# Patient Record
Sex: Female | Born: 1980 | Race: White | Hispanic: No | Marital: Married | Smoking: Never smoker
Health system: Southern US, Community
[De-identification: ages and names within clinical notes are randomized; demographics above are authoritative.]

## PROBLEM LIST (undated history)

## (undated) DIAGNOSIS — F32A Depression, unspecified: Secondary | ICD-10-CM

## (undated) DIAGNOSIS — Z8619 Personal history of other infectious and parasitic diseases: Secondary | ICD-10-CM

## (undated) DIAGNOSIS — F329 Major depressive disorder, single episode, unspecified: Secondary | ICD-10-CM

## (undated) DIAGNOSIS — D649 Anemia, unspecified: Secondary | ICD-10-CM

## (undated) HISTORY — DX: Major depressive disorder, single episode, unspecified: F32.9

## (undated) HISTORY — DX: Anemia, unspecified: D64.9

## (undated) HISTORY — PX: NO PAST SURGERIES: SHX2092

## (undated) HISTORY — DX: Personal history of other infectious and parasitic diseases: Z86.19

## (undated) HISTORY — DX: Depression, unspecified: F32.A

## (undated) HISTORY — PX: MOUTH SURGERY: SHX715

---

## 2010-08-04 LAB — HEPATITIS B SURFACE ANTIGEN: Hepatitis B Surface Ag: NEGATIVE

## 2010-08-04 LAB — ABO/RH: RH Type: POSITIVE

## 2010-08-04 LAB — GC/CHLAMYDIA PROBE AMP, GENITAL: Chlamydia: NEGATIVE

## 2010-08-04 LAB — RUBELLA ANTIBODY, IGM: Rubella: IMMUNE

## 2011-02-01 ENCOUNTER — Inpatient Hospital Stay (HOSPITAL_COMMUNITY): Admission: AD | Admit: 2011-02-01 | Payer: Self-pay | Source: Ambulatory Visit | Admitting: Obstetrics and Gynecology

## 2011-02-08 LAB — STREP B DNA PROBE: GBS: NEGATIVE

## 2011-03-03 ENCOUNTER — Telehealth (HOSPITAL_COMMUNITY): Payer: Self-pay | Admitting: *Deleted

## 2011-03-03 ENCOUNTER — Encounter (HOSPITAL_COMMUNITY): Payer: Self-pay | Admitting: *Deleted

## 2011-03-03 NOTE — Telephone Encounter (Signed)
Preadmission screen  

## 2011-03-08 ENCOUNTER — Encounter (HOSPITAL_COMMUNITY): Payer: Self-pay | Admitting: *Deleted

## 2011-03-09 ENCOUNTER — Inpatient Hospital Stay (HOSPITAL_COMMUNITY): Admission: AD | Admit: 2011-03-09 | Payer: Self-pay | Source: Ambulatory Visit | Admitting: Obstetrics and Gynecology

## 2011-03-11 ENCOUNTER — Inpatient Hospital Stay (HOSPITAL_COMMUNITY): Admission: RE | Admit: 2011-03-11 | Payer: Self-pay | Source: Ambulatory Visit

## 2011-03-12 ENCOUNTER — Telehealth (HOSPITAL_COMMUNITY): Payer: Self-pay | Admitting: *Deleted

## 2011-03-12 NOTE — Telephone Encounter (Signed)
Preadmission screen  

## 2011-03-15 ENCOUNTER — Other Ambulatory Visit: Payer: Self-pay | Admitting: Obstetrics and Gynecology

## 2011-03-15 ENCOUNTER — Inpatient Hospital Stay (HOSPITAL_COMMUNITY): Payer: BC Managed Care – PPO

## 2011-03-15 ENCOUNTER — Inpatient Hospital Stay (HOSPITAL_COMMUNITY)
Admission: RE | Admit: 2011-03-15 | Discharge: 2011-03-19 | DRG: 373 | Disposition: A | Discharge status: Home / Self Care | Payer: BC Managed Care – PPO | Source: Ambulatory Visit | Attending: Obstetrics and Gynecology | Admitting: Obstetrics and Gynecology

## 2011-03-15 DIAGNOSIS — O99892 Other specified diseases and conditions complicating childbirth: Secondary | ICD-10-CM | POA: Diagnosis present

## 2011-03-15 DIAGNOSIS — L909 Atrophic disorder of skin, unspecified: Secondary | ICD-10-CM | POA: Diagnosis present

## 2011-03-15 DIAGNOSIS — O480 Post-term pregnancy: Secondary | ICD-10-CM | POA: Diagnosis present

## 2011-03-15 DIAGNOSIS — Z3400 Encounter for supervision of normal first pregnancy, unspecified trimester: Secondary | ICD-10-CM

## 2011-03-15 LAB — CBC
HCT: 31.1 % — ABNORMAL LOW (ref 36.0–46.0)
Hemoglobin: 10.4 g/dL — ABNORMAL LOW (ref 12.0–15.0)
MCH: 33 pg (ref 26.0–34.0)
MCHC: 33.4 g/dL (ref 30.0–36.0)
MCV: 98.7 fL (ref 78.0–100.0)

## 2011-03-15 MED ORDER — FLEET ENEMA 7-19 GM/118ML RE ENEM: 1.0000 | ENEMA | RECTAL | Status: DC | PRN

## 2011-03-15 MED ORDER — TERBUTALINE SULFATE 1 MG/ML IJ SOLN: 0.2500 mg | Freq: Once | INTRAMUSCULAR | Status: AC | PRN

## 2011-03-15 MED ORDER — OXYTOCIN 20 UNITS IN LACTATED RINGERS INFUSION - SIMPLE
125.0000 mL/h | Freq: Once | INTRAVENOUS | Status: AC
  Administered 2011-03-17: 999 mL/h via INTRAVENOUS

## 2011-03-15 MED ORDER — OXYTOCIN BOLUS FROM INFUSION
500.0000 mL | Freq: Once | INTRAVENOUS | Status: DC
  Filled 2011-03-15: qty 500

## 2011-03-15 MED ORDER — CITRIC ACID-SODIUM CITRATE 334-500 MG/5ML PO SOLN: 30.0000 mL | ORAL | Status: DC | PRN

## 2011-03-15 MED ORDER — MISOPROSTOL 25 MCG QUARTER TABLET
25.0000 ug | ORAL_TABLET | ORAL | Status: DC | PRN
  Administered 2011-03-15 – 2011-03-16 (×3): 25 ug via VAGINAL
  Filled 2011-03-15 (×3): qty 0.25

## 2011-03-15 MED ORDER — LACTATED RINGERS IV SOLN
INTRAVENOUS | Status: DC
  Administered 2011-03-16 – 2011-03-17 (×2): via INTRAVENOUS

## 2011-03-15 MED ORDER — ACETAMINOPHEN 325 MG PO TABS: 650.0000 mg | ORAL_TABLET | ORAL | Status: DC | PRN

## 2011-03-15 MED ORDER — LACTATED RINGERS IV SOLN
500.0000 mL | INTRAVENOUS | Status: DC | PRN
  Administered 2011-03-17: 300 mL via INTRAVENOUS

## 2011-03-16 ENCOUNTER — Inpatient Hospital Stay (HOSPITAL_COMMUNITY): Payer: BC Managed Care – PPO | Admitting: Anesthesiology

## 2011-03-16 ENCOUNTER — Encounter (HOSPITAL_COMMUNITY): Payer: Self-pay | Admitting: Anesthesiology

## 2011-03-16 MED ORDER — TERBUTALINE SULFATE 1 MG/ML IJ SOLN: 0.2500 mg | Freq: Once | INTRAMUSCULAR | Status: AC | PRN

## 2011-03-16 MED ORDER — FENTANYL 2.5 MCG/ML BUPIVACAINE 1/10 % EPIDURAL INFUSION (WH - ANES)
INTRAMUSCULAR | Status: AC
  Administered 2011-03-16: 14 mL/h via EPIDURAL
  Filled 2011-03-16: qty 60

## 2011-03-16 MED ORDER — FENTANYL 2.5 MCG/ML BUPIVACAINE 1/10 % EPIDURAL INFUSION (WH - ANES)
14.0000 mL/h | INTRAMUSCULAR | Status: DC
  Administered 2011-03-16 – 2011-03-17 (×4): 14 mL/h via EPIDURAL
  Filled 2011-03-16 (×4): qty 60

## 2011-03-16 MED ORDER — LACTATED RINGERS IV SOLN
500.0000 mL | Freq: Once | INTRAVENOUS | Status: AC
  Administered 2011-03-16: 500 mL via INTRAVENOUS

## 2011-03-16 MED ORDER — PHENYLEPHRINE 40 MCG/ML (10ML) SYRINGE FOR IV PUSH (FOR BLOOD PRESSURE SUPPORT)
80.0000 ug | PREFILLED_SYRINGE | INTRAVENOUS | Status: DC | PRN
  Filled 2011-03-16: qty 5

## 2011-03-16 MED ORDER — OXYTOCIN 20 UNITS IN LACTATED RINGERS INFUSION - SIMPLE
1.0000 m[IU]/min | INTRAVENOUS | Status: DC
  Administered 2011-03-16: 1 m[IU]/min via INTRAVENOUS
  Filled 2011-03-16: qty 1000

## 2011-03-16 MED ORDER — ONDANSETRON 8 MG/NS 50 ML IVPB
8.0000 mg | Freq: Four times a day (QID) | INTRAVENOUS | Status: DC | PRN
  Administered 2011-03-16: 8 mg via INTRAVENOUS
  Filled 2011-03-16: qty 8

## 2011-03-16 MED ORDER — PHENYLEPHRINE 40 MCG/ML (10ML) SYRINGE FOR IV PUSH (FOR BLOOD PRESSURE SUPPORT): 80.0000 ug | PREFILLED_SYRINGE | INTRAVENOUS | Status: DC | PRN

## 2011-03-16 MED ORDER — EPHEDRINE 5 MG/ML INJ
10.0000 mg | INTRAVENOUS | Status: DC | PRN
  Filled 2011-03-16: qty 4

## 2011-03-16 MED ORDER — EPHEDRINE 5 MG/ML INJ: 10.0000 mg | INTRAVENOUS | Status: DC | PRN

## 2011-03-16 MED ORDER — DIPHENHYDRAMINE HCL 50 MG/ML IJ SOLN: 12.5000 mg | INTRAMUSCULAR | Status: DC | PRN

## 2011-03-16 MED ORDER — LIDOCAINE HCL 1.5 % IJ SOLN
INTRAMUSCULAR | Status: DC | PRN
  Administered 2011-03-16: 2 mL via INTRADERMAL
  Administered 2011-03-16 (×2): 5 mL via INTRADERMAL

## 2011-03-16 NOTE — Anesthesia Preprocedure Evaluation (Addendum)
Anesthesia Evaluation  Patient identified by MRN, date of birth, ID band Patient awake    Reviewed: Allergy & Precautions, H&P , NPO status , Patient's Chart, lab work & pertinent test results, reviewed documented beta blocker date and time   History of Anesthesia Complications Negative for: history of anesthetic complications  Airway Mallampati: I TM Distance: >3 FB Neck ROM: full    Dental  (+) Teeth Intact   Pulmonary neg pulmonary ROS,  clear to auscultation        Cardiovascular neg cardio ROS regular Normal    Neuro/Psych Negative Neurological ROS  Negative Psych ROS   GI/Hepatic negative GI ROS, Neg liver ROS,   Endo/Other  Negative Endocrine ROS  Renal/GU negative Renal ROS  Genitourinary negative   Musculoskeletal   Abdominal   Peds  Hematology negative hematology ROS (+)   Anesthesia Other Findings   Reproductive/Obstetrics (+) Pregnancy                           Anesthesia Physical Anesthesia Plan  ASA: II  Anesthesia Plan: Epidural   Post-op Pain Management:    Induction:   Airway Management Planned:   Additional Equipment:   Intra-op Plan:   Post-operative Plan:   Informed Consent: I have reviewed the patients History and Physical, chart, labs and discussed the procedure including the risks, benefits and alternatives for the proposed anesthesia with the patient or authorized representative who has indicated his/her understanding and acceptance.     Plan Discussed with:   Anesthesia Plan Comments:         Anesthesia Quick Evaluation  

## 2011-03-16 NOTE — H&P (Signed)
30yo G1 @40 +6 presents for postdates IOL  Past History - see [facility]ollister, GBS neg  Af, VSS Gen - NAD Abd - gravid, NT Cvx FT/thick vtx by [place] upon admission last pm  A/P:  IOL.

## 2011-03-16 NOTE — Progress Notes (Signed)
Pt feeling mild contractions.  cytotec #3 placed at 8am by rn  FHT reassuring Toco - Q2-4 Cvx FT/60/-2  A/P:  Continue postdates IOL

## 2011-03-16 NOTE — Progress Notes (Signed)
Pt getting more uncomfortable w/ ctx.  ? LOF  AF, VSS FHT reassuring Toco - q71min Cvx 1-2/60/-2 Unable to arom forebag b/c of pt discomfort  A/P:  Exp mngt.  If contractions become less freqent or no cvx change, will add pitocin

## 2011-03-16 NOTE — Anesthesia Procedure Notes (Signed)
Epidural Patient location during procedure: OB Start time: 03/16/2011 6:14 PM Reason for block: procedure for pain  Staffing Performed by: anesthesiologist   Preanesthetic Checklist Completed: patient identified, site marked, surgical consent, pre-op evaluation, timeout performed, IV checked, risks and benefits discussed and monitors and equipment checked  Epidural Patient position: sitting Prep: site prepped and draped and DuraPrep Patient monitoring: continuous pulse ox and blood pressure Approach: midline Injection technique: LOR air  Needle:  Needle type: Tuohy  Needle gauge: 17 G Needle length: 9 cm Needle insertion depth: 4 cm Catheter type: closed end flexible Catheter size: 19 Gauge Catheter at skin depth: 9 cm Test dose: negative  Assessment Events: blood not aspirated, injection not painful, no injection resistance, negative IV test and no paresthesia  Additional Notes Discussed risk of headache, infection, bleeding, nerve injury and failed or incomplete block.  Patient voices understanding and wishes to proceed.

## 2011-03-16 NOTE — Progress Notes (Signed)
Pt comfortable w/ epidural.  Resting comfortably  FHT reassuring Toco - Q3-4 Cvx 3/90/-2 Forebag ruptured - clear fluid  A/P:  Continue pitocin, exp mngt

## 2011-03-17 ENCOUNTER — Encounter (HOSPITAL_COMMUNITY): Payer: Self-pay

## 2011-03-17 ENCOUNTER — Other Ambulatory Visit: Payer: Self-pay | Admitting: Obstetrics and Gynecology

## 2011-03-17 DIAGNOSIS — Z3400 Encounter for supervision of normal first pregnancy, unspecified trimester: Secondary | ICD-10-CM

## 2011-03-17 MED ORDER — DIPHENHYDRAMINE HCL 25 MG PO CAPS: 25.0000 mg | ORAL_CAPSULE | Freq: Four times a day (QID) | ORAL | Status: DC | PRN

## 2011-03-17 MED ORDER — WITCH HAZEL-GLYCERIN EX PADS
1.0000 "application " | MEDICATED_PAD | CUTANEOUS | Status: DC | PRN
  Administered 2011-03-17: 1 via TOPICAL

## 2011-03-17 MED ORDER — LANOLIN HYDROUS EX OINT: TOPICAL_OINTMENT | CUTANEOUS | Status: DC | PRN

## 2011-03-17 MED ORDER — MEDROXYPROGESTERONE ACETATE 150 MG/ML IM SUSP: 150.0000 mg | INTRAMUSCULAR | Status: DC | PRN

## 2011-03-17 MED ORDER — ONDANSETRON HCL 4 MG PO TABS
4.0000 mg | ORAL_TABLET | ORAL | Status: DC | PRN
  Administered 2011-03-17: 4 mg via ORAL
  Filled 2011-03-17: qty 1

## 2011-03-17 MED ORDER — SIMETHICONE 80 MG PO CHEW: 80.0000 mg | CHEWABLE_TABLET | ORAL | Status: DC | PRN

## 2011-03-17 MED ORDER — MEASLES, MUMPS & RUBELLA VAC ~~LOC~~ INJ
0.5000 mL | INJECTION | Freq: Once | SUBCUTANEOUS | Status: DC
  Filled 2011-03-17: qty 0.5

## 2011-03-17 MED ORDER — ONDANSETRON HCL 4 MG/2ML IJ SOLN: 4.0000 mg | INTRAMUSCULAR | Status: DC | PRN

## 2011-03-17 MED ORDER — DIBUCAINE 1 % RE OINT
1.0000 "application " | TOPICAL_OINTMENT | RECTAL | Status: DC | PRN
  Administered 2011-03-17 – 2011-03-19 (×2): 1 via RECTAL
  Filled 2011-03-17 (×2): qty 28

## 2011-03-17 MED ORDER — PRENATAL PLUS 27-1 MG PO TABS
1.0000 | ORAL_TABLET | Freq: Every day | ORAL | Status: DC
  Administered 2011-03-18 – 2011-03-19 (×2): 1 via ORAL
  Filled 2011-03-17 (×2): qty 1

## 2011-03-17 MED ORDER — BENZOCAINE-MENTHOL 20-0.5 % EX AERO
INHALATION_SPRAY | CUTANEOUS | Status: AC
  Filled 2011-03-17: qty 56

## 2011-03-17 MED ORDER — BENZOCAINE-MENTHOL 20-0.5 % EX AERO: 1.0000 "application " | INHALATION_SPRAY | CUTANEOUS | Status: DC | PRN

## 2011-03-17 MED ORDER — TETANUS-DIPHTH-ACELL PERTUSSIS 5-2.5-18.5 LF-MCG/0.5 IM SUSP: 0.5000 mL | Freq: Once | INTRAMUSCULAR | Status: DC

## 2011-03-17 MED ORDER — LIDOCAINE HCL (PF) 1 % IJ SOLN
INTRAMUSCULAR | Status: AC
  Filled 2011-03-17: qty 30

## 2011-03-17 MED ORDER — SENNOSIDES-DOCUSATE SODIUM 8.6-50 MG PO TABS
2.0000 | ORAL_TABLET | Freq: Every day | ORAL | Status: DC
  Administered 2011-03-17 – 2011-03-18 (×2): 2 via ORAL

## 2011-03-17 MED ORDER — ACETAMINOPHEN 325 MG PO TABS
650.0000 mg | ORAL_TABLET | Freq: Four times a day (QID) | ORAL | Status: DC | PRN
  Administered 2011-03-17 – 2011-03-19 (×6): 650 mg via ORAL
  Filled 2011-03-17 (×2): qty 2
  Filled 2011-03-17: qty 1
  Filled 2011-03-17: qty 2
  Filled 2011-03-17: qty 1
  Filled 2011-03-17: qty 2
  Filled 2011-03-17: qty 1

## 2011-03-17 NOTE — Anesthesia Postprocedure Evaluation (Signed)
  Anesthesia Post-op Note  Patient: Real Cons  Procedure(s) Performed: * No procedures listed *  Patient Location: Mother/Baby  Anesthesia Type: Epidural  Level of Consciousness: awake, alert  and oriented  Airway and Oxygen Therapy: Patient Spontanous Breathing  Post-op Pain: mild  Post-op Assessment: Post-op Vital signs reviewed  Post-op Vital Signs: stable  Complications: No apparent anesthesia complications

## 2011-03-17 NOTE — Addendum Note (Signed)
Addendum  created 03/17/11 1341 by Quin Hoop Stellah Donovan   Modules edited:Charges VN, Notes Section

## 2011-03-17 NOTE — Progress Notes (Signed)
Pt comfortable w/ epidural.  CTSP after fetal decel.  Pitocin off, IVF bolus running  FHT - reassuring now with BTBV.  Baseline 115-120s w/ good scalp stim.  Early decels Toco Q2-3 Cvx 5/c/-1  A/P:  Watch closely, if decels persist or FHT become non-reassuring - will do c-section

## 2011-03-17 NOTE — Progress Notes (Signed)
SVD of vigerous female infant w/ apgars of 9,9.  Placenta delivered spontaneous w/ 3VC.   2nd degree lac repaired w/ 3-0 vicryl rapide.  Fundus firm.  EBL 500 .   Skin tag removed from right buttock.  Skin re-approximated with 3-0 vicryl rapide

## 2011-03-17 NOTE — Anesthesia Postprocedure Evaluation (Signed)
Anesthesia Post Note  Patient: Katherine Johnson  Procedure(s) Performed: * No procedures listed *  Anesthesia type: Epidural  Patient location: Mother/Baby  Post pain: Pain level controlled  Post assessment: Post-op Vital signs reviewed  Last Vitals:  Filed Vitals:   03/17/11 0801  BP: 105/72  Pulse: 100  Temp:   Resp:     Post vital signs: Reviewed  Level of consciousness: awake  Complications: No apparent anesthesia complications

## 2011-03-17 NOTE — Progress Notes (Deleted)
Pt comfortable w/ epidural  FHT reassuring Toco Q2-3 Cvx - c/c/+1  Will begin pushing

## 2011-03-17 NOTE — Progress Notes (Signed)
Pt resting comfortably w/ epidural.  FHT reassuring Toco - q2-4 Cvx c/c/+2  A/P:  Will start pushing

## 2011-03-18 LAB — CBC
HCT: 22.8 % — ABNORMAL LOW (ref 36.0–46.0)
Hemoglobin: 7.6 g/dL — ABNORMAL LOW (ref 12.0–15.0)
MCH: 33.3 pg (ref 26.0–34.0)
MCV: 100 fL (ref 78.0–100.0)
Platelets: 174 10*3/uL (ref 150–400)
RBC: 2.28 MIL/uL — ABNORMAL LOW (ref 3.87–5.11)
WBC: 11 10*3/uL — ABNORMAL HIGH (ref 4.0–10.5)

## 2011-03-18 MED ORDER — FERROUS SULFATE 325 (65 FE) MG PO TABS
325.0000 mg | ORAL_TABLET | Freq: Three times a day (TID) | ORAL | Status: DC
  Administered 2011-03-18 – 2011-03-19 (×3): 325 mg via ORAL
  Filled 2011-03-18 (×3): qty 1

## 2011-03-18 MED ORDER — HYDROCORTISONE ACE-PRAMOXINE 1-1 % RE FOAM
1.0000 | Freq: Two times a day (BID) | RECTAL | Status: DC
  Administered 2011-03-18: 1 via RECTAL
  Filled 2011-03-18: qty 10

## 2011-03-18 MED ORDER — BENZOCAINE-MENTHOL 20-0.5 % EX AERO
INHALATION_SPRAY | CUTANEOUS | Status: AC
  Filled 2011-03-18: qty 56

## 2011-03-18 NOTE — Progress Notes (Signed)
UR chart review completed.  

## 2011-03-18 NOTE — Progress Notes (Signed)
Subjective: Postpartum Day 1:  Patient reports tolerating PO, + flatus and no problems voiding.  Denies dizziness, SOB  Objective: Vital signs in last 24 hours: Temp:  [97.6 F (36.4 C)-99.2 F (37.3 C)] 98.2 F (36.8 C) (11/15 0558) Pulse Rate:  [76-104] 87  (11/15 0558) Resp:  [18] 18  (11/15 0558) BP: (94-105)/(63-72) 94/64 mmHg (11/15 0558)  Physical Exam:  General: alert and cooperative Lochia: appropriate Uterus:: firm Perineum intact, small hemorrhoid  DVT Evaluation: No evidence of DVT seen on physical exam.   Basename 03/18/11 0535 03/15/11 2045  HGB 7.6* 10.4*  HCT 22.8* 31.1*    Assessment/Plan: S/p vag delivery Continue current care. PRoctofoam HC FE  Elvis Laufer G 03/18/2011, 7:59 AM

## 2011-03-19 MED ORDER — HYDROCORTISONE ACE-PRAMOXINE 1-1 % RE FOAM: 1.0000 | Freq: Two times a day (BID) | RECTAL | Status: AC

## 2011-03-19 NOTE — Discharge Summary (Signed)
Obstetric Discharge Summary Reason for Admission: induction of labor Prenatal Procedures: ultrasound Intrapartum Procedures: spontaneous vaginal delivery Postpartum Procedures: none Complications-Operative and Postpartum: 2 degree perineal laceration Hemoglobin  Date Value Range Status  03/18/2011 7.6* 12.0-15.0 (g/dL) Final     HCT  Date Value Range Status  03/18/2011 22.8* 36.0-46.0 (%) Final    Discharge Diagnoses: Term Pregnancy-delivered  Discharge Information: Date: 03/19/2011 Activity: pelvic rest Diet: routine Medications: PNV, Colace and Iron Condition: stable Instructions: refer to practice specific booklet Discharge to: home   Newborn Data: Live born female  Birth Weight: 8 lb 0.2 oz (3634 g) APGAR: 9, 9  Home with mother.  Llesenia Fogal G 03/19/2011, 8:12 AM

## 2011-03-19 NOTE — Progress Notes (Signed)
Post Partum Day 2 Subjective: up ad lib, voiding, tolerating PO, + flatus and c/o hemorrhoidal discomfort  Objective: Blood pressure 95/61, pulse 87, temperature 98.1 F (36.7 C), temperature source Oral, resp. rate 18, last menstrual period 06/03/2010, unknown if currently breastfeeding.  Physical Exam:  General: alert and cooperative Lochia: appropriate Uterine Fundus: firm Perineum intact sm hemorrhoid DVT Evaluation: No evidence of DVT seen on physical exam.   Basename 03/18/11 0535  HGB 7.6*  HCT 22.8*    Assessment/Plan: Discharge home   LOS: 4 days   Katherine Johnson 03/19/2011, 8:07 AM

## 2012-05-02 LAB — OB RESULTS CONSOLE HIV ANTIBODY (ROUTINE TESTING): HIV: NONREACTIVE

## 2012-05-02 LAB — OB RESULTS CONSOLE GC/CHLAMYDIA
Chlamydia: NEGATIVE
Gonorrhea: NEGATIVE

## 2012-05-02 LAB — OB RESULTS CONSOLE RPR: RPR: NONREACTIVE

## 2012-05-02 LAB — OB RESULTS CONSOLE RUBELLA ANTIBODY, IGM: Rubella: IMMUNE

## 2012-05-03 NOTE — L&D Delivery Note (Signed)
Delivery Note  SVD viable female Apgars 9,9 over 2nd degree ML Laceration.  Placenta delivered spontaneously intact with 3VC. Repair with 2-0 Chromic with good support and hemostasis noted and R/V exam confirms.  PH art was sent.  [place] cord blood was done.  Mother and baby were doing well.  EBL 300cc  Candice Camp, MD

## 2012-11-27 ENCOUNTER — Telehealth: Payer: Self-pay | Admitting: Hematology and Oncology

## 2012-11-27 NOTE — Telephone Encounter (Signed)
S/W PT IN RE NP APPT 08/06 @ 1:30 W/DR. [place] REFERRING DR. Aundra Millet [facility] DX- ANEMIA; PREGNANCY HGB 9.4 37WKS WELCOME PACKET MAILED.

## 2012-11-28 ENCOUNTER — Telehealth: Payer: Self-pay | Admitting: Hematology and Oncology

## 2012-11-28 NOTE — Telephone Encounter (Signed)
C/D 11/28/12 for appt. 12/06/12

## 2012-12-06 ENCOUNTER — Telehealth: Payer: Self-pay | Admitting: Hematology and Oncology

## 2012-12-06 ENCOUNTER — Ambulatory Visit (HOSPITAL_BASED_OUTPATIENT_CLINIC_OR_DEPARTMENT_OTHER): Payer: BC Managed Care – PPO | Admitting: Hematology and Oncology

## 2012-12-06 ENCOUNTER — Encounter: Payer: Self-pay | Admitting: Hematology and Oncology

## 2012-12-06 ENCOUNTER — Ambulatory Visit: Payer: BC Managed Care – PPO

## 2012-12-06 ENCOUNTER — Other Ambulatory Visit (HOSPITAL_BASED_OUTPATIENT_CLINIC_OR_DEPARTMENT_OTHER): Payer: BC Managed Care – PPO | Admitting: Lab

## 2012-12-06 ENCOUNTER — Ambulatory Visit (HOSPITAL_BASED_OUTPATIENT_CLINIC_OR_DEPARTMENT_OTHER): Payer: BC Managed Care – PPO | Admitting: Lab

## 2012-12-06 VITALS — BP 101/68 | HR 89 | Temp 98.3°F | Resp 18 | Ht 61.0 in | Wt 134.0 lb

## 2012-12-06 DIAGNOSIS — O99019 Anemia complicating pregnancy, unspecified trimester: Secondary | ICD-10-CM

## 2012-12-06 DIAGNOSIS — D649 Anemia, unspecified: Secondary | ICD-10-CM

## 2012-12-06 LAB — CHCC SMEAR

## 2012-12-06 LAB — CBC & DIFF AND RETIC
BASO%: 0.1 % (ref 0.0–2.0)
HCT: 27.1 % — ABNORMAL LOW (ref 34.8–46.6)
MCHC: 33.2 g/dL (ref 31.5–36.0)
MONO#: 0.5 10*3/uL (ref 0.1–0.9)
NEUT%: 73.2 % (ref 38.4–76.8)
WBC: 7.3 10*3/uL (ref 3.9–10.3)
lymph#: 1.3 10*3/uL (ref 0.9–3.3)

## 2012-12-06 LAB — FOLATE: Folate: >20 ng/mL

## 2012-12-06 LAB — COMPREHENSIVE METABOLIC PANEL (CC13)
AST: 17 U/L (ref 5–34)
Albumin: 2.5 g/dL — ABNORMAL LOW (ref 3.5–5.0)
BUN: 7 mg/dL (ref 7.0–26.0)
CO2: 22 mEq/L (ref 22–29)
Calcium: 9 mg/dL (ref 8.4–10.4)
Chloride: 107 mEq/L (ref 98–109)
Glucose: 100 mg/dl (ref 70–140)
Potassium: 3.7 mEq/L (ref 3.5–5.1)

## 2012-12-06 LAB — VITAMIN B12: Vitamin B-12: 299 pg/mL (ref 211–911)

## 2012-12-06 LAB — LACTATE DEHYDROGENASE (CC13): LDH: 141 U/L (ref 125–245)

## 2012-12-06 NOTE — Progress Notes (Signed)
Checked in new patient. No financial issues. Gave mychart instructions-she may do if not phone and mail are still ok. Didn't ask if living will/POA.

## 2012-12-06 NOTE — Progress Notes (Signed)
[facility]  [phone] [phone]) [phone]   MEDICAL ONCOLOGY - INITIAL CONSULATION    Referral MD  Reason for Referral: Anemia  No chief complaint on file. HPI:   We had the pleasure of seeing Katherine Johnson in our hematology clinic today in consultation regarding her anemia. Katherine Johnson is a pleasant 32 years old 32 years old Caucasian female 4 is 4 is 34 ritual who is [redacted] weeks pregnant was found to be anemic with a hemoglobin of 7.7 on lab work that was done by her obstetrician. MCV on the blood work was 95.4 platelet count was 175K. patient has been on iron supplementation for the last few months. Patient denied any bleeding episodes, no recent travel or infection. No new medication. Denied any change in the urine or stool color. She feels well and she is really having no complaints except the usual symptoms of pregnancy.     No past medical history on file.:  Past Surgical History  Procedure Laterality Date  . Mouth surgery      as a child  :  Current Outpatient Prescriptions  Medication Sig Dispense Refill  . docusate sodium (COLACE) 100 MG capsule Take 100 mg by mouth daily.      . ranitidine (ZANTAC) 150 MG tablet Take 150 mg by mouth daily.      Marland Kitchen acetaminophen (TYLENOL) 500 MG tablet Take 1,000 mg by mouth every 6 (six) hours as needed. Patient used this medication for pain.       Marland Kitchen oxymetazoline (AFRIN) 0.05 % nasal spray Place 2 sprays into the nose 2 (two) times daily. Patient uses this medication for nasal congestion.       . prenatal vitamin w/FE, FA (PRENATAL 1 + 1) 27-1 MG TABS Take 1 tablet by mouth daily.         No current facility-administered medications for this visit.     Allergies  Allergen Reactions  . Codeine Nausea And Vomiting  . Ibuprofen Swelling    Lips swell  :  Family History  Problem Relation Age of Onset  . Depression Mother   . Depression Sister   . Depression Maternal Grandmother   . Heart failure  Maternal Grandfather   . Hypertension Maternal Grandfather   . Cancer Paternal Grandmother     breast  . Anesthesia problems Neg Hx   . Hypotension Neg Hx   . Malignant hyperthermia Neg Hx   . Pseudochol deficiency Neg Hx   :  History   Social History  . Marital Status: Married    Spouse Name: N/A    Number of Children: N/A  . Years of Education: N/A   Occupational History  . Not on file.   Social History Main Topics  . Smoking status: Never Smoker   . Smokeless tobacco: Never Used  . Alcohol Use: No  . Drug Use: No  . Sexually Active: Yes    Birth Control/ Protection: Pill   Other Topics Concern  . Not on file   Social History Narrative  . No narrative on file  :  Pertinent items are noted in HPI.  Exam: Blood pressure 101/68, pulse 89, temperature 98.3 F (36.8 C), temperature source Oral, resp. rate 18, height 5\' 1"  (1.549 m), weight 134 lb (60.782 kg), SpO2 100.00%, unknown if currently breastfeeding.   ECOG PERFORMANCE STATUS: 0 - Asymptomatic  GENERAL: No distress, well nourished.  SKIN:  No rashes or significant lesions  HEAD: Normocephalic, No masses,  lesions, tenderness or abnormalities  EYES: Conjunctiva are pink and non-injected  ENT: External ears normal ,lips , buccal mucosa, and tongue normal and mucous membranes are moist  LYMPH: No palpable lymphadenopathy  BREAST:Normal without mass, skin or nipple changes or axillary nodes,  LUNGS: Clear to auscultation, no crackles or wheezes HEART: Regular rate & rhythm, no murmurs, no gallops, S1 normal and S2 normal  ABDOMEN: Abdomen soft, non-tender, normal bowel sounds, no masses or organomegaly and no hepatosplenomegaly  MSK: No CVA tenderness and no tenderness on percussion of the back or rib cage. EXTREMITIES: No edema, no skin discoloration or tenderness NEURO: Alert & oriented, no focal motor/sensory deficits.  Lab Results  Component Value Date   WBC 7.3 12/06/2012   HGB 9.0* 12/06/2012   HCT  27.1* 12/06/2012   PLT 170 12/06/2012   GLUCOSE 100 12/06/2012   ALT 12 12/06/2012   AST 17 12/06/2012   NA 138 12/06/2012   K 3.7 12/06/2012   CREATININE 0.6 12/06/2012   BUN 7.0 12/06/2012   CO2 22 12/06/2012    Assessment and Plan: Katherine Johnson is 32 years old female always [redacted] weeks pregnant with normocytic anemia. Today in clinic with Real Cons we had a along discussion about her anemia. We we informed agent and her husband that we will go ahead and repeat her CBC, we also will get iron profile, folic acid and B12 level. However, if her anemia is normocytic most likely it is not due to iron deficiency, folic acid or Z61 deficiency; unless if she has a mixed picture. We also will check LDH, haptoglobin, along with bilirubin level to make sure patient is not hemolyzing. And of course we will review her peripheral blood smear. I also assured patient that the fact that her platelets count is normal, is reassuring sign.  Once we get the lab work back will call patient with the results if there is any need for immediate intervention. Otherwise patient will come back to our clinic next week to go over the results and the final plan. If patient deliver next week we would be happy to see her in the hospital if needed to.   Patient asked a series of questions which was answered hopefully to her staisfication.   Zachery Dakins, MD 12/06/2012 6:09 PM

## 2012-12-06 NOTE — Telephone Encounter (Signed)
Pt sent back to lab and given appt schedule for August.  °

## 2012-12-07 LAB — FERRITIN CHCC: Ferritin: 20 ng/ml (ref 9–269)

## 2012-12-07 LAB — IRON AND TIBC CHCC: Iron: 140 ug/dL (ref 41–142)

## 2012-12-08 ENCOUNTER — Telehealth: Payer: Self-pay | Admitting: Hematology and Oncology

## 2012-12-08 NOTE — Telephone Encounter (Signed)
Per desk nurse moved pt from CP1 to AJ. Time adjusted by to 12:15pm on 8/11. lmonvm for pt re change w/new time for 8/11 @ 12:15pm.

## 2012-12-11 ENCOUNTER — Encounter: Payer: Self-pay | Admitting: Internal Medicine

## 2012-12-11 ENCOUNTER — Ambulatory Visit (HOSPITAL_BASED_OUTPATIENT_CLINIC_OR_DEPARTMENT_OTHER): Payer: BC Managed Care – PPO | Admitting: Lab

## 2012-12-11 ENCOUNTER — Telehealth: Payer: Self-pay | Admitting: Hematology and Oncology

## 2012-12-11 ENCOUNTER — Ambulatory Visit (HOSPITAL_BASED_OUTPATIENT_CLINIC_OR_DEPARTMENT_OTHER): Payer: BC Managed Care – PPO | Admitting: Physician Assistant

## 2012-12-11 VITALS — BP 101/64 | HR 92 | Temp 98.1°F | Resp 18 | Ht 61.0 in | Wt 132.9 lb

## 2012-12-11 DIAGNOSIS — D649 Anemia, unspecified: Secondary | ICD-10-CM

## 2012-12-11 NOTE — Telephone Encounter (Signed)
Sent pt to lab and pt will call to schedule  appt

## 2012-12-11 NOTE — Patient Instructions (Addendum)
Follow up approximately 8 weeks after you delivery your baby. Call us for the appointment

## 2012-12-12 ENCOUNTER — Encounter (HOSPITAL_COMMUNITY): Payer: Self-pay | Admitting: *Deleted

## 2012-12-12 ENCOUNTER — Telehealth (HOSPITAL_COMMUNITY): Payer: Self-pay | Admitting: *Deleted

## 2012-12-12 LAB — DIRECT ANTIGLOBULIN TEST (NOT AT ARMC): DAT IgG: NEGATIVE

## 2012-12-12 NOTE — Telephone Encounter (Signed)
Preadmission screen  

## 2012-12-15 ENCOUNTER — Encounter (HOSPITAL_COMMUNITY): Payer: Self-pay | Admitting: *Deleted

## 2012-12-15 ENCOUNTER — Telehealth: Payer: Self-pay

## 2012-12-15 ENCOUNTER — Inpatient Hospital Stay (HOSPITAL_COMMUNITY): Admission: RE | Admit: 2012-12-15 | Payer: BC Managed Care – PPO | Source: Ambulatory Visit

## 2012-12-15 ENCOUNTER — Telehealth (HOSPITAL_COMMUNITY): Payer: Self-pay | Admitting: *Deleted

## 2012-12-15 NOTE — Telephone Encounter (Signed)
S/w pt that there is a receipt here on her visa card dated 12/06/12 for $80. She said she did not need it. I asked her twice with same response. I put receipt to be shredded.

## 2012-12-15 NOTE — Progress Notes (Signed)
[facility]  [phone] Fax:([phone]   MEDICAL ONCOLOGY       diagnosis: Anemia   No chief complaint on file. HPI:   Ms. Rickels presents accompanied by her husband for scheduled followup appointment regarding her anemia. She had laboratory studies drawn in our office when she was evaluated by Dr. Karel Jarvis on 12/06/2012. Her hemoglobin at that time was 9 g/dL. She had several other studies drawn at that time and presents today to discuss the results of those studies. She is due to deliver her baby 12/13/2012. We had the pleasure of seeing Ms. Zarling in our hematology clinic today in consultation regarding her anemia. Ms. Musil is a pleasant 32 years old Caucasian female  who is approximately [redacted] weeks pregnant was found to be anemic with a hemoglobin of 7.7 on lab work that was done by her obstetrician. MCV on the blood work was 95.4 platelet count was 175K. patient has been on iron supplementation for the last few months. Patient denied any bleeding episodes, no recent travel or infection. No new medication. Denied any change in the urine or stool color. She feels well and she is really having no complaints except the usual symptoms of late pregnancy.     Past Medical History  Diagnosis Date  . Hx of varicella   . 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 Anemia   :  Past Surgical History  Procedure Laterality Date  . Mouth surgery      as a child  . No past surgeries    :  Current Outpatient Prescriptions  Medication Sig Dispense Refill  . acetaminophen (TYLENOL) 500 MG tablet Take 1,000 mg by mouth every 6 (six) hours as needed. Patient used this medication for pain.       Marland Kitchen docusate sodium (COLACE) 100 MG capsule Take 100 mg by mouth daily.      Marland Kitchen oxymetazoline (AFRIN) 0.05 % nasal spray Place 2 sprays into the nose 2 (two) times daily. Patient uses this medication for nasal congestion.       . prenatal vitamin w/FE, FA (PRENATAL 1 + 1)  27-1 MG TABS Take 1 tablet by mouth daily.        . ranitidine (ZANTAC) 150 MG tablet Take 150 mg by mouth daily.       No current facility-administered medications for this visit.     Allergies  Allergen Reactions  . Codeine Nausea And Vomiting  . Ibuprofen Swelling    Lips swell  :  Family History  Problem Relation Age of Onset  . Depression Mother   . Depression Sister   . Depression Maternal Grandmother   . Hypertension Maternal Grandmother   . Heart failure Maternal Grandfather   . Hypertension Maternal Grandfather   . Cancer Paternal Grandmother     breast  . Anesthesia problems Neg Hx   . Hypotension Neg Hx   . Malignant hyperthermia Neg Hx   . Pseudochol deficiency Neg Hx   . Diabetes Paternal Grandfather   . Cancer Paternal Grandfather     lung  :  History   Social History  . Marital Status: Married    Spouse Name: N/A    Number of Children: N/A  . Years of Education: N/A   Occupational History  . Not on file.   Social History Main Topics  . Smoking status: Never Smoker   . Smokeless tobacco: Never Used  . Alcohol Use:  No  . Drug Use: No  . Sexual Activity: Yes    Birth Control/ Protection: Pill   Other Topics Concern  . Not on file   Social History Narrative  . No narrative on file  :  Pertinent items are noted in HPI.  Exam: Blood pressure 101/64, pulse 92, temperature 98.1 F (36.7 C), temperature source Oral, resp. rate 18, height 5\' 1"  (1.549 m), weight 132 lb 14.4 oz (60.283 kg), unknown if currently breastfeeding.   ECOG PERFORMANCE STATUS: 0 - Asymptomatic  GENERAL: No distress, well nourished.  SKIN:  No rashes or significant lesions  HEAD: Normocephalic, No masses, lesions, tenderness or abnormalities  EYES: Conjunctiva are pink and non-injected  ENT: External ears normal ,lips , buccal mucosa, and tongue normal and mucous membranes are moist  LYMPH: No palpable lymphadenopathy  BREAST:Normal without mass, skin or nipple  changes or axillary nodes,  LUNGS: Clear to auscultation, no crackles or wheezes HEART: Regular rate & rhythm, no murmurs, no gallops, S1 normal and S2 normal  ABDOMEN: Abdomen soft, non-tender, normal bowel sounds, no masses or organomegaly and no hepatosplenomegaly. Patient is currently [redacted] weeks pregnant, there is fetal movement as well as fetal heart sounds present. MSK: No CVA tenderness and no tenderness on percussion of the back or rib cage. EXTREMITIES: No edema, no skin discoloration or tenderness NEURO: Alert & oriented, no focal motor/sensory deficits.  Lab Results  Component Value Date   WBC 7.3 12/06/2012   HGB 9.0* 12/06/2012   HCT 27.1* 12/06/2012   PLT 170 12/06/2012   GLUCOSE 100 12/06/2012   ALT 12 12/06/2012   AST 17 12/06/2012   NA 138 12/06/2012   K 3.7 12/06/2012   CREATININE 0.6 12/06/2012   BUN 7.0 12/06/2012   CO2 22 12/06/2012    Assessment and Plan: Ms. Battin is 32 years old female approximately [redacted] weeks pregnant with normocytic anemia. The patient was discussed with Dr. Karel Jarvis. Her laboratory studies including iron studies, folate TSH and B12 were normal however the haptoglobin was not drawn. Patient's LDH was also normal. We will draw a haptoglobin today. The patient knows that when she is admitted to deliver her baby that the obstetrician may contact the hematology oncologist on call for a consultation if needed. We will plan to have the patient followup with [place] with repeat CBC differential and likely another set of iron studies approximately 8 weeks after she delivers her baby.   Patient asked a series of questions which were answered to her staisfication.   Laural Benes, [place] Sabine E, PA-C

## 2012-12-15 NOTE — Telephone Encounter (Signed)
Preadmission screen  

## 2012-12-20 ENCOUNTER — Inpatient Hospital Stay (HOSPITAL_COMMUNITY)
Admission: AD | Admit: 2012-12-20 | Discharge: 2012-12-23 | DRG: 373 | Disposition: A | Discharge status: Home / Self Care | Payer: BC Managed Care – PPO | Source: Ambulatory Visit | Attending: Obstetrics and Gynecology | Admitting: Obstetrics and Gynecology

## 2012-12-20 ENCOUNTER — Inpatient Hospital Stay (HOSPITAL_COMMUNITY): Admission: RE | Admit: 2012-12-20 | Payer: BC Managed Care – PPO | Source: Ambulatory Visit

## 2012-12-20 ENCOUNTER — Encounter (HOSPITAL_COMMUNITY): Payer: Self-pay | Admitting: *Deleted

## 2012-12-20 DIAGNOSIS — O480 Post-term pregnancy: Principal | ICD-10-CM | POA: Diagnosis present

## 2012-12-20 LAB — CBC
HCT: 27.7 % — ABNORMAL LOW (ref 36.0–46.0)
MCH: 34.2 pg — ABNORMAL HIGH (ref 26.0–34.0)
MCHC: 33.9 g/dL (ref 30.0–36.0)
MCV: 100.7 fL — ABNORMAL HIGH (ref 78.0–100.0)
Platelets: 190 10*3/uL (ref 150–400)
RDW: 13.1 % (ref 11.5–15.5)

## 2012-12-20 MED ORDER — OXYTOCIN 40 UNITS IN LACTATED RINGERS INFUSION - SIMPLE MED
1.0000 m[IU]/min | INTRAVENOUS | Status: DC
  Administered 2012-12-21: 2 m[IU]/min via INTRAVENOUS
  Administered 2012-12-21: 4 m[IU]/min via INTRAVENOUS
  Filled 2012-12-20: qty 1000

## 2012-12-20 MED ORDER — OXYTOCIN 40 UNITS IN LACTATED RINGERS INFUSION - SIMPLE MED: 62.5000 mL/h | INTRAVENOUS | Status: DC

## 2012-12-20 MED ORDER — ONDANSETRON HCL 4 MG/2ML IJ SOLN: 4.0000 mg | Freq: Four times a day (QID) | INTRAMUSCULAR | Status: DC | PRN

## 2012-12-20 MED ORDER — LACTATED RINGERS IV SOLN: 500.0000 mL | INTRAVENOUS | Status: DC | PRN

## 2012-12-20 MED ORDER — TERBUTALINE SULFATE 1 MG/ML IJ SOLN: 0.2500 mg | Freq: Once | INTRAMUSCULAR | Status: AC | PRN

## 2012-12-20 MED ORDER — CITRIC ACID-SODIUM CITRATE 334-500 MG/5ML PO SOLN
30.0000 mL | ORAL | Status: DC | PRN
  Administered 2012-12-21: 30 mL via ORAL
  Filled 2012-12-20: qty 15

## 2012-12-20 MED ORDER — OXYTOCIN BOLUS FROM INFUSION
500.0000 mL | INTRAVENOUS | Status: DC
  Administered 2012-12-21: 500 mL via INTRAVENOUS

## 2012-12-20 MED ORDER — MISOPROSTOL 25 MCG QUARTER TABLET
25.0000 ug | ORAL_TABLET | ORAL | Status: AC
  Administered 2012-12-20 – 2012-12-21 (×2): 25 ug via VAGINAL
  Filled 2012-12-20 (×2): qty 0.25

## 2012-12-20 MED ORDER — LACTATED RINGERS IV SOLN
INTRAVENOUS | Status: DC
  Administered 2012-12-20: 20:00:00 via INTRAVENOUS

## 2012-12-20 MED ORDER — LIDOCAINE HCL (PF) 1 % IJ SOLN
30.0000 mL | INTRAMUSCULAR | Status: DC | PRN
  Filled 2012-12-20 (×2): qty 30

## 2012-12-20 MED ORDER — ACETAMINOPHEN 325 MG PO TABS: 650.0000 mg | ORAL_TABLET | ORAL | Status: DC | PRN

## 2012-12-20 MED ORDER — ZOLPIDEM TARTRATE 5 MG PO TABS
5.0000 mg | ORAL_TABLET | Freq: Every evening | ORAL | Status: DC | PRN
  Administered 2012-12-20: 5 mg via ORAL
  Filled 2012-12-20: qty 1

## 2012-12-21 ENCOUNTER — Encounter (HOSPITAL_COMMUNITY): Payer: Self-pay | Admitting: Anesthesiology

## 2012-12-21 ENCOUNTER — Inpatient Hospital Stay (HOSPITAL_COMMUNITY): Payer: BC Managed Care – PPO | Admitting: Anesthesiology

## 2012-12-21 MED ORDER — ACETAMINOPHEN 325 MG PO TABS: 650.0000 mg | ORAL_TABLET | Freq: Four times a day (QID) | ORAL | Status: DC | PRN

## 2012-12-21 MED ORDER — SENNOSIDES-DOCUSATE SODIUM 8.6-50 MG PO TABS
2.0000 | ORAL_TABLET | Freq: Every day | ORAL | Status: DC
  Administered 2012-12-22: 2 via ORAL

## 2012-12-21 MED ORDER — MEASLES, MUMPS & RUBELLA VAC ~~LOC~~ INJ: 0.5000 mL | INJECTION | Freq: Once | SUBCUTANEOUS | Status: DC

## 2012-12-21 MED ORDER — PHENYLEPHRINE 40 MCG/ML (10ML) SYRINGE FOR IV PUSH (FOR BLOOD PRESSURE SUPPORT)
80.0000 ug | PREFILLED_SYRINGE | INTRAVENOUS | Status: DC | PRN
  Filled 2012-12-21: qty 2
  Filled 2012-12-21: qty 5

## 2012-12-21 MED ORDER — PRENATAL MULTIVITAMIN CH: 1.0000 | ORAL_TABLET | Freq: Every day | ORAL | Status: DC

## 2012-12-21 MED ORDER — FENTANYL 2.5 MCG/ML BUPIVACAINE 1/10 % EPIDURAL INFUSION (WH - ANES)
INTRAMUSCULAR | Status: DC | PRN
  Administered 2012-12-21: 14 mL/h via EPIDURAL

## 2012-12-21 MED ORDER — TRAMADOL HCL 50 MG PO TABS
50.0000 mg | ORAL_TABLET | Freq: Four times a day (QID) | ORAL | Status: DC
  Administered 2012-12-21 – 2012-12-23 (×7): 50 mg via ORAL
  Filled 2012-12-21 (×8): qty 1

## 2012-12-21 MED ORDER — FENTANYL 2.5 MCG/ML BUPIVACAINE 1/10 % EPIDURAL INFUSION (WH - ANES)
14.0000 mL/h | INTRAMUSCULAR | Status: DC | PRN
  Administered 2012-12-21: 14 mL/h via EPIDURAL
  Filled 2012-12-21: qty 125

## 2012-12-21 MED ORDER — DIBUCAINE 1 % RE OINT
1.0000 "application " | TOPICAL_OINTMENT | RECTAL | Status: DC | PRN
  Administered 2012-12-22: 1 via RECTAL
  Filled 2012-12-21: qty 28

## 2012-12-21 MED ORDER — PRENATAL MULTIVITAMIN CH
1.0000 | ORAL_TABLET | Freq: Every day | ORAL | Status: DC
  Administered 2012-12-22 – 2012-12-23 (×2): 1 via ORAL
  Filled 2012-12-21 (×2): qty 1

## 2012-12-21 MED ORDER — DIPHENHYDRAMINE HCL 25 MG PO CAPS: 25.0000 mg | ORAL_CAPSULE | Freq: Four times a day (QID) | ORAL | Status: DC | PRN

## 2012-12-21 MED ORDER — ZOLPIDEM TARTRATE 5 MG PO TABS: 5.0000 mg | ORAL_TABLET | Freq: Every evening | ORAL | Status: DC | PRN

## 2012-12-21 MED ORDER — EPHEDRINE 5 MG/ML INJ
10.0000 mg | INTRAVENOUS | Status: DC | PRN
  Administered 2012-12-21: 10 mg via INTRAVENOUS
  Filled 2012-12-21: qty 2

## 2012-12-21 MED ORDER — LACTATED RINGERS IV SOLN
500.0000 mL | Freq: Once | INTRAVENOUS | Status: AC
  Administered 2012-12-21: 500 mL via INTRAVENOUS

## 2012-12-21 MED ORDER — DIPHENHYDRAMINE HCL 50 MG/ML IJ SOLN: 12.5000 mg | INTRAMUSCULAR | Status: DC | PRN

## 2012-12-21 MED ORDER — BENZOCAINE-MENTHOL 20-0.5 % EX AERO
1.0000 "application " | INHALATION_SPRAY | CUTANEOUS | Status: DC | PRN
  Administered 2012-12-21: 1 via TOPICAL
  Filled 2012-12-21: qty 56

## 2012-12-21 MED ORDER — ONDANSETRON HCL 4 MG PO TABS: 4.0000 mg | ORAL_TABLET | ORAL | Status: DC | PRN

## 2012-12-21 MED ORDER — WITCH HAZEL-GLYCERIN EX PADS: 1.0000 "application " | MEDICATED_PAD | CUTANEOUS | Status: DC | PRN

## 2012-12-21 MED ORDER — PHENYLEPHRINE 40 MCG/ML (10ML) SYRINGE FOR IV PUSH (FOR BLOOD PRESSURE SUPPORT)
80.0000 ug | PREFILLED_SYRINGE | INTRAVENOUS | Status: DC | PRN
  Filled 2012-12-21: qty 2

## 2012-12-21 MED ORDER — LANOLIN HYDROUS EX OINT: TOPICAL_OINTMENT | CUTANEOUS | Status: DC | PRN

## 2012-12-21 MED ORDER — LIDOCAINE HCL (PF) 1 % IJ SOLN
INTRAMUSCULAR | Status: DC | PRN
  Administered 2012-12-21 (×2): 9 mL

## 2012-12-21 MED ORDER — SIMETHICONE 80 MG PO CHEW: 80.0000 mg | CHEWABLE_TABLET | ORAL | Status: DC | PRN

## 2012-12-21 MED ORDER — TETANUS-DIPHTH-ACELL PERTUSSIS 5-2.5-18.5 LF-MCG/0.5 IM SUSP: 0.5000 mL | Freq: Once | INTRAMUSCULAR | Status: DC

## 2012-12-21 MED ORDER — MEDROXYPROGESTERONE ACETATE 150 MG/ML IM SUSP: 150.0000 mg | INTRAMUSCULAR | Status: DC | PRN

## 2012-12-21 MED ORDER — EPHEDRINE 5 MG/ML INJ
10.0000 mg | INTRAVENOUS | Status: DC | PRN
  Filled 2012-12-21: qty 2
  Filled 2012-12-21 (×2): qty 4

## 2012-12-21 MED ORDER — ONDANSETRON HCL 4 MG/2ML IJ SOLN: 4.0000 mg | INTRAMUSCULAR | Status: DC | PRN

## 2012-12-21 NOTE — H&P (Signed)
Katherine Johnson is a 32 y.o. female presenting for IOL due to favorable cervix and postdates Pregnancy uncomplicated. History OB History   Grav Para Term Preterm Abortions TAB SAB Ect Mult Living   2 1 1       1      Past Medical History  Diagnosis Date  . Hx of varicella   . 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 Anemia    Past Surgical History  Procedure Laterality Date  . Mouth surgery      as a child  . No past surgeries     Family History: family history includes Cancer in her paternal grandfather and paternal grandmother; Depression in her maternal grandmother, mother, and sister; Diabetes in her paternal grandfather; Heart failure in her maternal grandfather; Hypertension in her maternal grandfather and maternal grandmother. There is no history of Anesthesia problems, Hypotension, Malignant hyperthermia, or Pseudochol deficiency. Social History:  reports that she has never smoked. She has never used smokeless tobacco. She reports that she does not drink alcohol or use illicit drugs.   Prenatal Transfer Tool  Maternal Diabetes: No Genetic Screening: Normal Maternal Ultrasounds/Referrals: Normal Fetal Ultrasounds or other Referrals:  None Maternal Substance Abuse:  No Significant Maternal Medications:  None Significant Maternal Lab Results:  None Other Comments:  None  ROS  Dilation: 3 Effacement (%): 70 Station: -2 Exam by:: dr Rana Snare Blood pressure 94/60, pulse 88, temperature 98.7 F (37.1 C), temperature source Oral, resp. rate 20, height 5\' 1"  (1.549 m), weight 61.236 kg (135 lb), unknown if currently breastfeeding. Exam Physical Exam  Prenatal labs: ABO, Rh: O/Positive/-- (12/31 0000) Antibody: Negative (12/31 0000) Rubella: Immune (12/31 0000) RPR: NON REACTIVE (08/20 2005)  HBsAg: Negative (12/31 0000)  HIV: Non-reactive (12/31 0000)  GBS: Negative (07/15 0000)   Assessment/Plan: IUP at 41 weeks AROM/Pit Anticipate SVD   Ajayla Iglesias  C 12/21/2012, 9:23 AM

## 2012-12-21 NOTE — Anesthesia Preprocedure Evaluation (Signed)
Anesthesia Evaluation  Patient identified by MRN, date of birth, ID band Patient awake    Reviewed: Allergy & Precautions, H&P , NPO status , Patient's Chart, lab work & pertinent test results  Airway Mallampati: I TM Distance: >3 FB Neck ROM: full    Dental no notable dental hx.    Pulmonary neg pulmonary ROS,    Pulmonary exam normal       Cardiovascular negative cardio ROS      Neuro/Psych negative neurological ROS     GI/Hepatic negative GI ROS, Neg liver ROS,   Endo/Other  negative endocrine ROS  Renal/GU negative Renal ROS  negative genitourinary   Musculoskeletal negative musculoskeletal ROS (+)   Abdominal Normal abdominal exam  (+)   Peds  Hematology negative hematology ROS (+)   Anesthesia Other Findings   Reproductive/Obstetrics (+) Pregnancy                           Anesthesia Physical Anesthesia Plan  ASA: II  Anesthesia Plan: Epidural   Post-op Pain Management:    Induction:   Airway Management Planned:   Additional Equipment:   Intra-op Plan:   Post-operative Plan:   Informed Consent: I have reviewed the patients History and Physical, chart, labs and discussed the procedure including the risks, benefits and alternatives for the proposed anesthesia with the patient or authorized representative who has indicated his/her understanding and acceptance.     Plan Discussed with:   Anesthesia Plan Comments:         Anesthesia Quick Evaluation  

## 2012-12-21 NOTE — Anesthesia Procedure Notes (Signed)
Epidural Patient location during procedure: OB Start time: 12/21/2012 11:21 AM End time: 12/21/2012 11:30 AM  Staffing Anesthesiologist: Sandrea Hughs Performed by: anesthesiologist   Preanesthetic Checklist Completed: patient identified, surgical consent, pre-op evaluation, timeout performed, IV checked, risks and benefits discussed and monitors and equipment checked  Epidural Patient position: sitting Prep: site prepped and draped and DuraPrep Patient monitoring: continuous pulse ox and blood pressure Approach: midline Injection technique: LOR air  Needle:  Needle type: Tuohy  Needle gauge: 17 G Needle length: 9 cm and 9 Needle insertion depth: 4 cm Catheter type: closed end flexible Catheter size: 19 Gauge Catheter at skin depth: 9 cm Test dose: negative and Other  Assessment Sensory level: T9 Events: blood not aspirated, injection not painful, no injection resistance, negative IV test and no paresthesia  Additional Notes Reason for block:procedure for pain

## 2012-12-22 LAB — CBC
MCV: 101.7 fL — ABNORMAL HIGH (ref 78.0–100.0)
Platelets: 170 10*3/uL (ref 150–400)
RBC: 2.38 MIL/uL — ABNORMAL LOW (ref 3.87–5.11)
RDW: 13.1 % (ref 11.5–15.5)
WBC: 11.1 10*3/uL — ABNORMAL HIGH (ref 4.0–10.5)

## 2012-12-22 NOTE — Progress Notes (Signed)
Post Partum Day 1 Subjective: no complaints, up ad lib, voiding and tolerating PO  Objective: Blood pressure 73/43, pulse 76, temperature 98.6 F (37 C), temperature source Oral, resp. rate 19, height 5\' 1"  (1.549 m), weight 135 lb (61.236 kg), SpO2 100.00%, unknown if currently breastfeeding.  Physical Exam:  General: alert and cooperative Lochia: appropriate Uterine Fundus: firm Incision: perineum intact DVT Evaluation: No evidence of DVT seen on physical exam. Negative Homan's sign. No cords or calf tenderness. No significant calf/ankle edema.   Recent Labs  12/20/12 2005 12/22/12 0605  HGB 9.4* 8.1*  HCT 27.7* 24.2*    Assessment/Plan: Plan for discharge tomorrow and Circumcision prior to discharge   LOS: 2 days   Katherine Johnson 12/22/2012, 8:42 AM

## 2012-12-22 NOTE — Anesthesia Postprocedure Evaluation (Signed)
  Anesthesia Post-op Note  Patient: Katherine Johnson  Procedure(s) Performed: * No procedures listed *  Patient Location: PACU and Mother/Baby  Anesthesia Type:Epidural  Level of Consciousness: awake, alert , oriented and patient cooperative  Airway and Oxygen Therapy: Patient Spontanous Breathing  Post-op Pain: none  Post-op Assessment: Post-op Vital signs reviewed, Patient's Cardiovascular Status Stable and Respiratory Function Stable  Post-op Vital Signs: Reviewed and stable  Complications: No apparent anesthesia complications

## 2012-12-23 NOTE — Discharge Summary (Signed)
Obstetric Discharge Summary Reason for Admission: induction of labor Prenatal Procedures: none Intrapartum Procedures: spontaneous vaginal delivery Postpartum Procedures: none Complications-Operative and Postpartum: second degree perineal laceration HGB  Date Value Range Status  12/06/2012 9.0* 11.6 - 15.9 g/dL Final     Hemoglobin  Date Value Range Status  12/22/2012 8.1* 12.0 - 15.0 g/dL Final     HCT  Date Value Range Status  12/22/2012 24.2* 36.0 - 46.0 % Final  12/06/2012 27.1* 34.8 - 46.6 % Final    Physical Exam:  General: alert Lochia: appropriate Uterine Fundus: firm Incision: healing well DVT Evaluation: No evidence of DVT seen on physical exam.  Discharge Diagnoses: Term Pregnancy-delivered  Discharge Information: Date: 12/23/2012 Activity: pelvic rest Diet: routine Medications: PNV and Ibuprofen Condition: stable Instructions: refer to practice specific booklet Discharge to: home   Newborn Data: Live born female  Birth Weight: 7 lb 14.3 oz (3581 g) APGAR: 9, 9  Home with mother.  Jamyiah Labella S 12/23/2012, 8:37 AM

## 2013-01-08 ENCOUNTER — Encounter: Payer: Self-pay | Admitting: Obstetrics and Gynecology

## 2013-08-04 ENCOUNTER — Encounter (HOSPITAL_COMMUNITY): Payer: Self-pay | Admitting: Emergency Medicine

## 2013-08-04 ENCOUNTER — Emergency Department (HOSPITAL_COMMUNITY): Payer: BC Managed Care – PPO

## 2013-08-04 ENCOUNTER — Emergency Department (HOSPITAL_COMMUNITY)
Admission: EM | Admit: 2013-08-04 | Discharge: 2013-08-04 | Disposition: A | Discharge status: Home / Self Care | Payer: BC Managed Care – PPO | Attending: Emergency Medicine | Admitting: Emergency Medicine

## 2013-08-04 DIAGNOSIS — Z79899 Other long term (current) drug therapy: Secondary | ICD-10-CM | POA: Insufficient documentation

## 2013-08-04 DIAGNOSIS — Z8619 Personal history of other infectious and parasitic diseases: Secondary | ICD-10-CM | POA: Insufficient documentation

## 2013-08-04 DIAGNOSIS — Z3202 Encounter for pregnancy test, result negative: Secondary | ICD-10-CM | POA: Insufficient documentation

## 2013-08-04 DIAGNOSIS — R1011 Right upper quadrant pain: Secondary | ICD-10-CM | POA: Insufficient documentation

## 2013-08-04 DIAGNOSIS — R109 Unspecified abdominal pain: Secondary | ICD-10-CM

## 2013-08-04 DIAGNOSIS — Z8659 Personal history of other mental and behavioral disorders: Secondary | ICD-10-CM | POA: Insufficient documentation

## 2013-08-04 DIAGNOSIS — Z862 Personal history of diseases of the blood and blood-forming organs and certain disorders involving the immune mechanism: Secondary | ICD-10-CM | POA: Insufficient documentation

## 2013-08-04 DIAGNOSIS — R110 Nausea: Secondary | ICD-10-CM | POA: Insufficient documentation

## 2013-08-04 LAB — URINALYSIS, ROUTINE W REFLEX MICROSCOPIC
BILIRUBIN URINE: NEGATIVE
Glucose, UA: NEGATIVE mg/dL
HGB URINE DIPSTICK: NEGATIVE
Ketones, ur: NEGATIVE mg/dL
Leukocytes, UA: NEGATIVE
Nitrite: NEGATIVE
PROTEIN: NEGATIVE mg/dL
SPECIFIC GRAVITY, URINE: 1.014 (ref 1.005–1.030)
UROBILINOGEN UA: 0.2 mg/dL (ref 0.0–1.0)
pH: 7 (ref 5.0–8.0)

## 2013-08-04 LAB — CBC WITH DIFFERENTIAL/PLATELET
Basophils Absolute: 0 10*3/uL (ref 0.0–0.1)
Basophils Relative: 0 % (ref 0–1)
Eosinophils Absolute: 0.1 10*3/uL (ref 0.0–0.7)
Eosinophils Relative: 3 % (ref 0–5)
HEMATOCRIT: 39.6 % (ref 36.0–46.0)
HEMOGLOBIN: 13.5 g/dL (ref 12.0–15.0)
LYMPHS ABS: 1.6 10*3/uL (ref 0.7–4.0)
Lymphocytes Relative: 34 % (ref 12–46)
MCH: 31.8 pg (ref 26.0–34.0)
MCHC: 34.1 g/dL (ref 30.0–36.0)
MCV: 93.2 fL (ref 78.0–100.0)
MONOS PCT: 9 % (ref 3–12)
Monocytes Absolute: 0.4 10*3/uL (ref 0.1–1.0)
NEUTROS ABS: 2.7 10*3/uL (ref 1.7–7.7)
NEUTROS PCT: 54 % (ref 43–77)
Platelets: 235 10*3/uL (ref 150–400)
RBC: 4.25 MIL/uL (ref 3.87–5.11)
RDW: 12.4 % (ref 11.5–15.5)
WBC: 4.8 10*3/uL (ref 4.0–10.5)

## 2013-08-04 LAB — COMPREHENSIVE METABOLIC PANEL
ALK PHOS: 80 U/L (ref 39–117)
ALT: 22 U/L (ref 0–35)
AST: 16 U/L (ref 0–37)
Albumin: 3.9 g/dL (ref 3.5–5.2)
BILIRUBIN TOTAL: 0.9 mg/dL (ref 0.3–1.2)
BUN: 12 mg/dL (ref 6–23)
CHLORIDE: 103 meq/L (ref 96–112)
CO2: 23 mEq/L (ref 19–32)
Calcium: 9.2 mg/dL (ref 8.4–10.5)
Creatinine, Ser: 0.63 mg/dL (ref 0.50–1.10)
GLUCOSE: 80 mg/dL (ref 70–99)
POTASSIUM: 3.7 meq/L (ref 3.7–5.3)
Sodium: 139 mEq/L (ref 137–147)
Total Protein: 7.3 g/dL (ref 6.0–8.3)

## 2013-08-04 LAB — POC URINE PREG, ED: Preg Test, Ur: NEGATIVE

## 2013-08-04 LAB — LIPASE, BLOOD: LIPASE: 27 U/L (ref 11–59)

## 2013-08-04 MED ORDER — ESOMEPRAZOLE MAGNESIUM 40 MG PO CPDR: 40.0000 mg | DELAYED_RELEASE_CAPSULE | Freq: Every day | ORAL | Status: DC

## 2013-08-04 MED ORDER — SUCRALFATE 1 G PO TABS: 1.0000 g | ORAL_TABLET | Freq: Three times a day (TID) | ORAL | Status: DC

## 2013-08-04 NOTE — ED Notes (Signed)
Pt c/o abdominal pain with n/v onset Tuesday night. Pt reports last time vomiting was Wednesday. Pt seen at urgent care yesterday but was unable to get ultrasound done at that time due to not available at the facility at that time.

## 2013-08-04 NOTE — Discharge Instructions (Signed)
Your ultrasound and labs were negative for any abnormlaity. It is possible that you have developed a gastric ulcer from your GERD during pregnancy which is usually more painful after eating and at night. Please take the medications i have prescribed and follow up with your primary care doctor. These medications appears safe for nursing.  Abdominal (belly) pain can be caused by many things. Your caregiver performed an examination and possibly ordered blood/urine tests and imaging (CT scan, x-rays, ultrasound). Many cases can be observed and treated at home after initial evaluation in the emergency department. Even though you are being discharged home, abdominal pain can be unpredictable. Therefore, you need a repeated exam if your pain does not resolve, returns, or worsens. Most patients with abdominal pain don't have to be admitted to the hospital or have surgery, but serious problems like appendicitis and gallbladder attacks can start out as nonspecific pain. Many abdominal conditions cannot be diagnosed in one visit, so follow-up evaluations are very important. SEEK IMMEDIATE MEDICAL ATTENTION IF: The pain does not go away or becomes severe.  A temperature above 101 develops.  Repeated vomiting occurs (multiple episodes).  The pain becomes localized to portions of the abdomen. The right side could possibly be appendicitis. In an adult, the left lower portion of the abdomen could be colitis or diverticulitis.  Blood is being passed in stools or vomit (bright red or black tarry stools).  Return also if you develop chest pain, difficulty breathing, dizziness or fainting, or become confused, poorly responsive, or inconsolable (young children). Diet for Peptic Ulcer Disease Peptic ulcer disease is a term used to describe open sores in the stomach and digestive tract. These sores can be painful, and the pain can be worse when the stomach is empty. These ulcers can lead to bleeding in the esophagus,  stomach, and intestines (gastrointestinaltract). Nutrition therapy can help ease the discomfort of peptic ulcer disease.   GENERAL DIET GUIDELINES FOR PEPTIC ULCER DISEASE  Your diet should be rich in fruits, vegetables, and whole grains. It should also be low in fat.   Avoid foods that can irritate your sores.   Avoid foods that are not tolerated or foods that cause pain. This may be different for different people. FOODS AND DRINKS TO AVOID  High-fat dairy and milk products including whole milk, cream, chocolate milk, butter, sour cream, or cream cheese.   High-fat meats and poultry including hot dogs, fried meats or poultry, meats with skin, sausage, or bacon.   Pepper.  Alcohol.  Chocolate.  Tea, coffee, and cola (regular and caffeine).  Lard or hydrogenated oils such as stick margarine.  SAMPLE MEAL PLAN This meal plan is approximately 2000 calories based on https://www.bernard.org/ meal planning guidelines.  Breakfast   cup cooked oatmeal.  1 cup strawberries.  1 cup low-fat milk.  1 oz almonds. Snack  1 cup cucumber slices.  6 oz yogurt (made from low-fat or fat-free milk). Lunch  2 slices whole-wheat bread.  2 oz sliced [place].  2 tsp mayonnaise.  1 cup blueberries.  1 cup snap peas. Snack  6 whole-wheat crackers.  1 oz string cheese. Dinner   cup brown rice.  1 cup mixed veggies.  1 tsp olive oil.  3 oz grilled fish. Document Released: 07/12/2011 Document Reviewed: 07/12/2011 [facility] Patient Information 2014 [place], [place]. Peptic Ulcer A peptic ulcer is a sore in the lining of in your esophagus (esophageal ulcer), stomach (gastric ulcer), or in the first part of your small intestine (duodenal  ulcer). The ulcer causes erosion into the deeper tissue. CAUSES  Normally, the lining of the stomach and the small intestine protects itself from the acid that digests food. The protective lining can be damaged by:  An infection caused by a  bacterium called Helicobacter pylori (H. pylori).  Regular use of nonsteroidal anti-inflammatory drugs (NSAIDs), such as ibuprofen or aspirin.  Smoking tobacco. Other risk factors include being older than 50, drinking alcohol excessively, and having a family history of ulcer disease.  SYMPTOMS   Burning pain or gnawing in the area between the chest and the belly button.  Heartburn.  Nausea and vomiting.  Bloating. The pain can be worse on an empty stomach and at night. If the ulcer results in bleeding, it can cause:  Black, tarry stools.  Vomiting of bright red blood.  Vomiting of coffee ground looking materials. DIAGNOSIS  A diagnosis is usually made based upon your history and an exam. Other tests and procedures may be performed to find the cause of the ulcer. Finding a cause will help determine the best treatment. Tests and procedures may include:  Blood tests, stool tests, or breath tests to check for the bacterium H. pylori.  An upper gastrointestinal (GI) series of the esophagus, stomach, and small intestine.  An endoscopy to examine the esophagus, stomach, and small intestine.  A biopsy. TREATMENT  Treatment may include:  Eliminating the cause of the ulcer, such as smoking, NSAIDs, or alcohol.  Medicines to reduce the amount of acid in your digestive tract.  Antibiotic medicines if the ulcer is caused by the H. pylori bacterium.  An upper endoscopy to treat a bleeding ulcer.  Surgery if the bleeding is severe or if the ulcer created a hole somewhere in the digestive system. HOME CARE INSTRUCTIONS   Avoid tobacco, alcohol, and caffeine. Smoking can increase the acid in the stomach, and continued smoking will impair the healing of ulcers.  Avoid foods and drinks that seem to cause discomfort or aggravate your ulcer.  Only take medicines as directed by your caregiver. Do not substitute over-the-counter medicines for prescription medicines without talking to your  caregiver.  Keep any follow-up appointments and tests as directed. SEEK MEDICAL CARE IF:   Your do not improve within 7 days of starting treatment.  You have ongoing indigestion or heartburn. SEEK IMMEDIATE MEDICAL CARE IF:   You have sudden, sharp, or persistent abdominal pain.  You have bloody or dark black, tarry stools.  You vomit blood or vomit that looks like coffee grounds.  You become light headed, weak, or feel faint.  You become sweaty or clammy. MAKE SURE YOU:   Understand these instructions.  Will watch your condition.  Will get help right away if you are not doing well or get worse. Document Released: 04/16/2000 Document Revised: 01/12/2012 Document Reviewed: 11/17/2011 [facility] Patient Information 2014 [place], [place]. Esomeprazole capsules What is this medicine? ESOMEPRAZOLE (es oh ME pray zol) prevents the production of acid in the stomach. It is used to treat gastroesophageal reflux disease (GERD), ulcers, certain bacteria in the stomach, and inflammation of the esophagus. It can also be used to prevent ulcers in patients taking medicines called NSAIDs. You can also buy this medicine without a prescription to treat the symptoms of heartburn. The non-prescription product is not for long-term use, unless otherwise directed by your doctor or health care professional. This medicine may be used for other purposes; ask your health care provider or pharmacist if you have questions. COMMON BRAND  NAME(S): Nexium What should I tell my health care provider before I take this medicine? They need to know if you have any of these conditions: -bloody or black, tarry stools -chest pain -have had heartburn for over 3 months -have heartburn with dizziness, lightheadedness, or sweating -liver disease -low levels of magnesium in the blood -nausea, vomiting -stomach pain -trouble swallowing -unexplained weight loss -vomiting with blood -wheezing -an unusual or allergic  reaction to esomeprazole, other medicines, foods, dyes, or preservatives -pregnant or trying to get pregnant -breast-feeding How should I use this medicine? Take this medicine by mouth. Swallow the capsules whole with a drink of water. Follow the directions on the prescription or product label. Do not crush, break or chew. The capsules can be opened and the contents sprinkled on applesauce. Do not crush the contents into the food. This medicine works best if taken on an empty stomach at least one hour before a meal. Take your medicine at regular intervals. Do not take your medicine more often than directed. Talk to your pediatrician regarding the use of this medicine in children. Special care may be needed. Overdosage: If you think you have taken too much of this medicine contact a poison control center or emergency room at once. NOTE: This medicine is only for you. Do not share this medicine with others. What if I miss a dose? If you miss a dose, take it as soon as you can. If it is almost time for your next dose, take only that dose. Do not take double or extra doses. What may interact with this medicine? Do not take this medicine with any of the following medications: -atazanavir This medicine may also interact with the following medications: -ampicillin -antiviral medicines for HIV or AIDS -certain medicines for fungal infections like ketoconazole and itraconazole -cilostazol -clopidogrel -diazepam -digoxin -erlotinib -diuretics -iron salts -methotrexate -St. John's Wort -tacrolimus -rifampin -warfarin This list may not describe all possible interactions. Give your health care provider a list of all the medicines, herbs, non-prescription drugs, or dietary supplements you use. Also tell them if you smoke, drink alcohol, or use illegal drugs. Some items may interact with your medicine. What should I watch for while using this medicine? If you are taking this medicine without a  prescription, it may take 1 to 4 days for it to fully relieve your heartburn.  If you are using this medicine with a prescription from your healthcare professional for a more serious condition, it can take several days before your condition gets better. Check with your doctor or health care professional if your condition does not start to get better, or if it gets worse. If you take this medicine for long periods of time, you may need blood work done. What side effects may I notice from receiving this medicine? Side effects that you should report to your doctor or health care professional as soon as possible: -allergic reactions like skin rash, itching or hives, swelling of the face, lips, or tongue -bone, muscle or joint pain -breathing problems -chest pain or chest tightness -dark yellow or brown urine -fast, irregular heartbeat -feeling faint or lightheaded -fever or sore throat -muscle spasms -tremors -unusual bleeding or bruising -unusually weak or tired -upset stomach -yellowing of the eyes or skin Side effects that usually do not require medical attention (Report these to your doctor or health care professional if they continue or are bothersome.): -constipation -diarrhea -dry mouth -headache -nausea -stomach pain or gas -vomiting This list may not describe  all possible side effects. Call your doctor for medical advice about side effects. You may report side effects to FDA at 1-800-FDA-1088. Where should I keep my medicine? Keep out of the reach of children. Store at room temperature between 15 and 30 degrees C (59 and 86 degrees F). Protect from light and moisture. Throw away any unused medicine after the expiration date. NOTE: This sheet is a summary. It may not cover all possible information. If you have questions about this medicine, talk to your doctor, pharmacist, or health care provider.  2014, Elsevier/Gold Standard. (2012-08-01 14:58:20)   Sucralfate tablets What  is this medicine? SUCRALFATE (SOO kral fate) helps to treat ulcers of the intestine. This medicine may be used for other purposes; ask your health care provider or pharmacist if you have questions. COMMON BRAND NAME(S): Carafate What should I tell my health care provider before I take this medicine? They need to know if you have any of these conditions: -kidney disease -an unusual or allergic reaction to sucralfate, other medicines, foods, dyes, or preservatives -pregnant or trying to get pregnant -breast-feeding How should I use this medicine? Take this medicine by mouth with a glass of water. Follow the directions on the prescription label. This medicine works best if you take it on an empty stomach, 1 hour before meals. Take your doses at regular intervals. Do not take your medicine more often than directed. Do not stop taking except on your doctor's advice. Talk to your pediatrician regarding the use of this medicine in children. Special care may be needed. Overdosage: If you think you have taken too much of this medicine contact a poison control center or emergency room at once. NOTE: This medicine is only for you. Do not share this medicine with others. What if I miss a dose? If you miss a dose, take it as soon as you can. If it is almost time for your next dose, take only that dose. Do not take double or extra doses. What may interact with this medicine? -antacid -cimetidine -digoxin -ketoconazole -phenytoin -quinidine -ranitidine -some antibiotics like ciprofloxacin, norfloxacin, and ofloxacin -theophylline -thyroid hormones -warfarin This list may not describe all possible interactions. Give your health care provider a list of all the medicines, herbs, non-prescription drugs, or dietary supplements you use. Also tell them if you smoke, drink alcohol, or use illegal drugs. Some items may interact with your medicine. What should I watch for while using this medicine? Visit your  doctor or health care professional for regular check ups. Let your doctor know if your symptoms do not improve or if you feel worse. Antacids should not be taken within one half hour before or after this medicine. What side effects may I notice from receiving this medicine? Side effects that you should report to your doctor or health care professional as soon as possible: -allergic reactions like skin rash, itching or hives, swelling of the face, lips, or tongue -difficulty breathing Side effects that usually do not require medical attention (report to your doctor or health care professional if they continue or are bothersome): -back pain -constipation -drowsy, dizzy -dry mouth -headache -stomach upset, gas -trouble sleeping This list may not describe all possible side effects. Call your doctor for medical advice about side effects. You may report side effects to FDA at 1-800-FDA-1088. Where should I keep my medicine? Keep out of the reach of children. Store at room temperature between 15 and 30 degrees C (59 and 86 degrees F). Keep container tightly  closed. Throw away any unused medicine after the expiration date. NOTE: This sheet is a summary. It may not cover all possible information. If you have questions about this medicine, talk to your doctor, pharmacist, or health care provider.  2014, Elsevier/Gold Standard. (2007-12-20 15:46:20)

## 2013-08-04 NOTE — ED Notes (Addendum)
Called US for ETA. Pt. Notified

## 2013-08-04 NOTE — ED Notes (Signed)
PT called times 2 no answer.

## 2013-08-04 NOTE — ED Provider Notes (Signed)
CSN: [id number]     Arrival date & time 08/04/13  1144 History   First MD Initiated Contact with Patient 08/04/13 1220     Chief Complaint  Patient presents with  . Abdominal Pain     (Consider location/radiation/quality/duration/timing/severity/associated sxs/prior Treatment) HPI Katherine Johnson 33 year old female who presents emergency chief complaint of right upper quadrant abdominal pain.  Onset of pain was 5 days ago, intermittent, worse after eating and at night.  She has associated nausea without vomiting.  No previous abdominal surgeries, she denies any urinary or vaginal symptoms, she denies any respiratory symptoms.  She is status post normal vaginal delivery 7 months ago.  She is currently breast-feeding.  She's not having regular menstrual periods yet. She denies a history of gastric ulcers.  Past Medical History  Diagnosis Date  . Hx of varicella   . 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 Anemia    Past Surgical History  Procedure Laterality Date  . Mouth surgery      as a child  . No past surgeries     Family History  Problem Relation Age of Onset  . Depression Mother   . Depression Sister   . Depression Maternal Grandmother   . Hypertension Maternal Grandmother   . Heart failure Maternal Grandfather   . Hypertension Maternal Grandfather   . Cancer Paternal Grandmother     breast  . Anesthesia problems Neg Hx   . Hypotension Neg Hx   . Malignant hyperthermia Neg Hx   . Pseudochol deficiency Neg Hx   . Diabetes Paternal Grandfather   . Cancer Paternal Grandfather     lung   History  Substance Use Topics  . Smoking status: Never Smoker   . Smokeless tobacco: Never Used  . Alcohol Use: No   OB History   Grav Para Term Preterm Abortions TAB SAB Ect Mult Living   2 2 2       2      Review of Systems  Ten systems reviewed and are negative for acute change, except as noted in the HPI.     Allergies  Codeine and Ibuprofen  Home Medications    Current Outpatient Rx  Name  Route  Sig  Dispense  Refill  . Prenatal Vit-Fe Fumarate-FA (PRENATAL MULTIVITAMIN) TABS tablet   Oral   Take 1 tablet by mouth daily at 12 noon.         . ranitidine (ZANTAC) 150 MG tablet   Oral   Take 150 mg by mouth daily.          BP 107/70  Pulse 69  Temp(Src) 98.7 F (37.1 C) (Oral)  Resp 18  Ht 5\' 1"  (1.549 m)  Wt 114 lb (51.71 kg)  BMI 21.55 kg/m2  SpO2 100%  LMP 03/15/2013  Breastfeeding? Yes Physical Exam Physical Exam  Nursing note and vitals reviewed. Constitutional: She is oriented to person, place, and time. She appears well-developed and well-nourished. No distress.  HENT:  Head: Normocephalic and atraumatic.  Eyes: Conjunctivae normal and EOM are normal. Pupils are equal, round, and reactive to light. No scleral icterus.  Neck: Normal range of motion.  Cardiovascular: Normal rate, regular rhythm and normal heart sounds.  Exam reveals no gallop and no friction rub.   No murmur heard. Pulmonary/Chest: Effort normal and breath sounds normal. No respiratory distress.  Abdominal: Soft. Bowel sounds are normal. She exhibits no distension and no mass.  TTP RUQ, Pos Murphy;s sign.  Neurological: She is alert and oriented to person, place, and time.  Skin: Skin is warm and dry. She is not diaphoretic.    ED Course  Procedures (including critical care time) Labs Review Labs Reviewed  CBC WITH DIFFERENTIAL  COMPREHENSIVE METABOLIC PANEL  LIPASE, BLOOD  URINALYSIS, ROUTINE W REFLEX MICROSCOPIC  POC URINE PREG, ED   Imaging Review No results found.   EKG Interpretation None      MDM   Final diagnoses:  Abdominal pain   1:07 PM BP 107/70  Pulse 69  Temp(Src) 98.7 F (37.1 C) (Oral)  Resp 18  Ht 5\' 1"  (1.549 m)  Wt 114 lb (51.71 kg)  BMI 21.55 kg/m2  SpO2 100%  LMP 03/15/2013  Breastfeeding? Yes Patient with RUQ pain, + murphy's. High suspicions for cholecystitis.  4:18 PM Filed Vitals:   08/04/13 1233  08/04/13 1325 08/04/13 1508 08/04/13 1606  BP: 107/70 90/69 110/78 91/66  Pulse: 69 72 62 67  Temp:      TempSrc:      Resp:      Height:      Weight:      SpO2: 100% 100% 100% 100%    Patient with normal labs/ us. Post prandial pain.  Question possible peptic ulcer disease.  Patient will begin taking Nexium, Carafate.  Patient should follow up closely with her primary care.   Arthor CaptainAbigail Demetruis Depaul, PA-C 08/04/13 2030

## 2013-08-04 NOTE — ED Notes (Signed)
PA at bedside.

## 2013-08-05 NOTE — ED Provider Notes (Signed)
Medical screening examination/treatment/procedure(s) were performed by non-physician practitioner and as supervising physician I was immediately available for consultation/collaboration.   EKG Interpretation None        Pariss Hommes B. Linday Rhodes, MD 08/05/13 1108 

## 2014-02-19 ENCOUNTER — Other Ambulatory Visit: Payer: Self-pay | Admitting: Obstetrics and Gynecology

## 2014-02-20 LAB — CYTOLOGY - PAP

## 2014-03-04 ENCOUNTER — Encounter (HOSPITAL_COMMUNITY): Payer: Self-pay | Admitting: Emergency Medicine

## 2015-01-14 LAB — OB RESULTS CONSOLE HEPATITIS B SURFACE ANTIGEN: HEP B S AG: NEGATIVE

## 2015-01-14 LAB — OB RESULTS CONSOLE HIV ANTIBODY (ROUTINE TESTING): HIV: NONREACTIVE

## 2015-01-14 LAB — OB RESULTS CONSOLE RPR: RPR: NONREACTIVE

## 2015-01-14 LAB — OB RESULTS CONSOLE ABO/RH: RH TYPE: POSITIVE

## 2015-01-14 LAB — OB RESULTS CONSOLE RUBELLA ANTIBODY, IGM: RUBELLA: IMMUNE

## 2015-01-14 LAB — OB RESULTS CONSOLE GC/CHLAMYDIA
Chlamydia: NEGATIVE
Gonorrhea: NEGATIVE

## 2015-01-14 LAB — OB RESULTS CONSOLE ANTIBODY SCREEN: Antibody Screen: NEGATIVE

## 2015-01-22 ENCOUNTER — Other Ambulatory Visit: Payer: Self-pay | Admitting: Obstetrics and Gynecology

## 2015-01-23 LAB — CYTOLOGY - PAP

## 2015-05-27 ENCOUNTER — Inpatient Hospital Stay (HOSPITAL_COMMUNITY)
Admission: AD | Admit: 2015-05-27 | Payer: BC Managed Care – PPO | Source: Ambulatory Visit | Admitting: Obstetrics and Gynecology

## 2015-07-16 LAB — OB RESULTS CONSOLE GBS: STREP GROUP B AG: POSITIVE

## 2015-08-11 ENCOUNTER — Telehealth (HOSPITAL_COMMUNITY): Payer: Self-pay | Admitting: *Deleted

## 2015-08-11 ENCOUNTER — Encounter (HOSPITAL_COMMUNITY): Payer: Self-pay | Admitting: *Deleted

## 2015-08-11 NOTE — Telephone Encounter (Signed)
Preadmission screen  

## 2015-08-12 ENCOUNTER — Telehealth (HOSPITAL_COMMUNITY): Payer: Self-pay | Admitting: *Deleted

## 2015-08-12 NOTE — Telephone Encounter (Signed)
Preadmission screen  

## 2015-08-18 ENCOUNTER — Encounter (HOSPITAL_COMMUNITY): Payer: Self-pay | Admitting: *Deleted

## 2015-08-18 ENCOUNTER — Telehealth (HOSPITAL_COMMUNITY): Payer: Self-pay | Admitting: *Deleted

## 2015-08-18 NOTE — Telephone Encounter (Signed)
Preadmission screen  

## 2015-08-19 ENCOUNTER — Inpatient Hospital Stay (HOSPITAL_COMMUNITY)
Admission: AD | Admit: 2015-08-19 | Discharge: 2015-08-21 | DRG: 775 | Disposition: A | Discharge status: Home / Self Care | Payer: BLUE CROSS/BLUE SHIELD | Source: Ambulatory Visit | Attending: Obstetrics and Gynecology | Admitting: Obstetrics and Gynecology

## 2015-08-19 ENCOUNTER — Encounter (HOSPITAL_COMMUNITY): Payer: Self-pay | Admitting: *Deleted

## 2015-08-19 ENCOUNTER — Inpatient Hospital Stay (HOSPITAL_COMMUNITY): Payer: BLUE CROSS/BLUE SHIELD | Admitting: Anesthesiology

## 2015-08-19 DIAGNOSIS — Z8249 Family history of ischemic heart disease and other diseases of the circulatory system: Secondary | ICD-10-CM

## 2015-08-19 DIAGNOSIS — O323XX0 Maternal care for face, brow and chin presentation, not applicable or unspecified: Secondary | ICD-10-CM | POA: Diagnosis present

## 2015-08-19 DIAGNOSIS — O99824 Streptococcus B carrier state complicating childbirth: Secondary | ICD-10-CM | POA: Diagnosis present

## 2015-08-19 DIAGNOSIS — Z3A40 40 weeks gestation of pregnancy: Secondary | ICD-10-CM

## 2015-08-19 DIAGNOSIS — O4202 Full-term premature rupture of membranes, onset of labor within 24 hours of rupture: Principal | ICD-10-CM | POA: Diagnosis present

## 2015-08-19 DIAGNOSIS — Z818 Family history of other mental and behavioral disorders: Secondary | ICD-10-CM | POA: Diagnosis not present

## 2015-08-19 DIAGNOSIS — Z833 Family history of diabetes mellitus: Secondary | ICD-10-CM

## 2015-08-19 LAB — CBC
HCT: 32.9 % — ABNORMAL LOW (ref 36.0–46.0)
HEMOGLOBIN: 11.3 g/dL — AB (ref 12.0–15.0)
MCH: 33.6 pg (ref 26.0–34.0)
MCHC: 34.3 g/dL (ref 30.0–36.0)
MCV: 97.9 fL (ref 78.0–100.0)
PLATELETS: 173 10*3/uL (ref 150–400)
RBC: 3.36 MIL/uL — ABNORMAL LOW (ref 3.87–5.11)
RDW: 13.1 % (ref 11.5–15.5)
WBC: 7.7 10*3/uL (ref 4.0–10.5)

## 2015-08-19 LAB — POCT FERN TEST: POCT Fern Test: POSITIVE

## 2015-08-19 LAB — TYPE AND SCREEN
ABO/RH(D): O POS
ANTIBODY SCREEN: NEGATIVE

## 2015-08-19 LAB — RPR: RPR: NONREACTIVE

## 2015-08-19 MED ORDER — TRAMADOL HCL 50 MG PO TABS
50.0000 mg | ORAL_TABLET | Freq: Four times a day (QID) | ORAL | Status: DC | PRN
  Administered 2015-08-20 – 2015-08-21 (×5): 50 mg via ORAL
  Filled 2015-08-19 (×5): qty 1

## 2015-08-19 MED ORDER — PENICILLIN G POTASSIUM 5000000 UNITS IJ SOLR
5.0000 10*6.[IU] | Freq: Once | INTRAMUSCULAR | Status: AC
  Administered 2015-08-19: 5 10*6.[IU] via INTRAVENOUS
  Filled 2015-08-19: qty 5

## 2015-08-19 MED ORDER — PENICILLIN G POTASSIUM 5000000 UNITS IJ SOLR: 5.0000 10*6.[IU] | Freq: Once | INTRAMUSCULAR | Status: DC

## 2015-08-19 MED ORDER — SIMETHICONE 80 MG PO CHEW: 80.0000 mg | CHEWABLE_TABLET | ORAL | Status: DC | PRN

## 2015-08-19 MED ORDER — LACTATED RINGERS IV SOLN: INTRAVENOUS | Status: DC

## 2015-08-19 MED ORDER — ZOLPIDEM TARTRATE 5 MG PO TABS: 5.0000 mg | ORAL_TABLET | Freq: Every evening | ORAL | Status: DC | PRN

## 2015-08-19 MED ORDER — PRENATAL MULTIVITAMIN CH
1.0000 | ORAL_TABLET | Freq: Every day | ORAL | Status: DC
  Administered 2015-08-20: 1 via ORAL
  Filled 2015-08-19: qty 1

## 2015-08-19 MED ORDER — FLEET ENEMA 7-19 GM/118ML RE ENEM: 1.0000 | ENEMA | RECTAL | Status: DC | PRN

## 2015-08-19 MED ORDER — LACTATED RINGERS IV SOLN
INTRAVENOUS | Status: DC
  Administered 2015-08-19 (×3): via INTRAVENOUS

## 2015-08-19 MED ORDER — LACTATED RINGERS IV SOLN
500.0000 mL | INTRAVENOUS | Status: DC | PRN
  Administered 2015-08-19: 500 mL via INTRAVENOUS

## 2015-08-19 MED ORDER — ACETAMINOPHEN 325 MG PO TABS: 650.0000 mg | ORAL_TABLET | ORAL | Status: DC | PRN

## 2015-08-19 MED ORDER — PENICILLIN G POTASSIUM 5000000 UNITS IJ SOLR: 2.5000 10*6.[IU] | INTRAMUSCULAR | Status: DC

## 2015-08-19 MED ORDER — OXYTOCIN 10 UNIT/ML IJ SOLN: 2.5000 [IU]/h | INTRAVENOUS | Status: DC

## 2015-08-19 MED ORDER — SENNOSIDES-DOCUSATE SODIUM 8.6-50 MG PO TABS
2.0000 | ORAL_TABLET | ORAL | Status: DC
  Administered 2015-08-20 – 2015-08-21 (×2): 2 via ORAL
  Filled 2015-08-19 (×2): qty 2

## 2015-08-19 MED ORDER — WITCH HAZEL-GLYCERIN EX PADS
1.0000 "application " | MEDICATED_PAD | CUTANEOUS | Status: DC | PRN
  Administered 2015-08-19: 1 via TOPICAL

## 2015-08-19 MED ORDER — DIPHENHYDRAMINE HCL 25 MG PO CAPS: 25.0000 mg | ORAL_CAPSULE | Freq: Four times a day (QID) | ORAL | Status: DC | PRN

## 2015-08-19 MED ORDER — FENTANYL 2.5 MCG/ML BUPIVACAINE 1/10 % EPIDURAL INFUSION (WH - ANES)
14.0000 mL/h | INTRAMUSCULAR | Status: DC | PRN
  Administered 2015-08-19: 14 mL/h via EPIDURAL
  Filled 2015-08-19: qty 125

## 2015-08-19 MED ORDER — EPHEDRINE 5 MG/ML INJ
10.0000 mg | INTRAVENOUS | Status: DC | PRN
  Filled 2015-08-19: qty 2
  Filled 2015-08-19: qty 4

## 2015-08-19 MED ORDER — TETANUS-DIPHTH-ACELL PERTUSSIS 5-2.5-18.5 LF-MCG/0.5 IM SUSP: 0.5000 mL | Freq: Once | INTRAMUSCULAR | Status: DC

## 2015-08-19 MED ORDER — EPHEDRINE 5 MG/ML INJ
10.0000 mg | INTRAVENOUS | Status: AC | PRN
  Administered 2015-08-19 (×2): 10 mg via INTRAVENOUS

## 2015-08-19 MED ORDER — PHENYLEPHRINE 40 MCG/ML (10ML) SYRINGE FOR IV PUSH (FOR BLOOD PRESSURE SUPPORT)
80.0000 ug | PREFILLED_SYRINGE | INTRAVENOUS | Status: DC | PRN
  Filled 2015-08-19: qty 2

## 2015-08-19 MED ORDER — LIDOCAINE HCL (PF) 1 % IJ SOLN
INTRAMUSCULAR | Status: DC | PRN
  Administered 2015-08-19 (×2): 4 mL via EPIDURAL

## 2015-08-19 MED ORDER — CITRIC ACID-SODIUM CITRATE 334-500 MG/5ML PO SOLN
30.0000 mL | ORAL | Status: DC | PRN
  Filled 2015-08-19: qty 15

## 2015-08-19 MED ORDER — ONDANSETRON HCL 4 MG/2ML IJ SOLN: 4.0000 mg | INTRAMUSCULAR | Status: DC | PRN

## 2015-08-19 MED ORDER — COCONUT OIL OIL
1.0000 "application " | TOPICAL_OIL | Status: DC | PRN
  Administered 2015-08-20: 1 via TOPICAL
  Filled 2015-08-19: qty 120

## 2015-08-19 MED ORDER — DIBUCAINE 1 % RE OINT
1.0000 "application " | TOPICAL_OINTMENT | RECTAL | Status: DC | PRN
  Administered 2015-08-20: 1 via RECTAL
  Filled 2015-08-19: qty 28

## 2015-08-19 MED ORDER — OXYTOCIN 10 UNIT/ML IJ SOLN
1.0000 m[IU]/min | INTRAVENOUS | Status: DC
  Administered 2015-08-19: 2 m[IU]/min via INTRAVENOUS
  Filled 2015-08-19: qty 4

## 2015-08-19 MED ORDER — LIDOCAINE HCL (PF) 1 % IJ SOLN
30.0000 mL | INTRAMUSCULAR | Status: DC | PRN
  Filled 2015-08-19: qty 30

## 2015-08-19 MED ORDER — PHENYLEPHRINE 40 MCG/ML (10ML) SYRINGE FOR IV PUSH (FOR BLOOD PRESSURE SUPPORT)
80.0000 ug | PREFILLED_SYRINGE | INTRAVENOUS | Status: DC | PRN
  Filled 2015-08-19: qty 2
  Filled 2015-08-19: qty 20

## 2015-08-19 MED ORDER — ONDANSETRON HCL 4 MG PO TABS: 4.0000 mg | ORAL_TABLET | ORAL | Status: DC | PRN

## 2015-08-19 MED ORDER — DEXTROSE 5 % IV SOLN
2.5000 10*6.[IU] | INTRAVENOUS | Status: DC
  Administered 2015-08-19 (×2): 2.5 10*6.[IU] via INTRAVENOUS
  Filled 2015-08-19 (×4): qty 2.5

## 2015-08-19 MED ORDER — ONDANSETRON HCL 4 MG/2ML IJ SOLN: 4.0000 mg | Freq: Four times a day (QID) | INTRAMUSCULAR | Status: DC | PRN

## 2015-08-19 MED ORDER — LACTATED RINGERS IV SOLN
500.0000 mL | Freq: Once | INTRAVENOUS | Status: AC
  Administered 2015-08-19: 500 mL via INTRAVENOUS

## 2015-08-19 MED ORDER — BENZOCAINE-MENTHOL 20-0.5 % EX AERO
1.0000 "application " | INHALATION_SPRAY | CUTANEOUS | Status: DC | PRN
  Administered 2015-08-20: 1 via TOPICAL
  Filled 2015-08-19: qty 56

## 2015-08-19 MED ORDER — DIPHENHYDRAMINE HCL 50 MG/ML IJ SOLN
12.5000 mg | INTRAMUSCULAR | Status: DC | PRN
Start: 2015-08-19 — End: 2015-08-19

## 2015-08-19 MED ORDER — TERBUTALINE SULFATE 1 MG/ML IJ SOLN
0.2500 mg | Freq: Once | INTRAMUSCULAR | Status: DC | PRN
  Filled 2015-08-19: qty 1

## 2015-08-19 MED ORDER — OXYTOCIN BOLUS FROM INFUSION
500.0000 mL | INTRAVENOUS | Status: DC
  Administered 2015-08-19: 500 mL via INTRAVENOUS

## 2015-08-19 NOTE — MAU Note (Addendum)
PT SAYS  AT 0255   [facility]-  CLEAR  FLUID.     VE IN OFFICE ON Thursday -  1 CM.   DENIES HSV ANDMRSA.    GBS- POSITIVE.    [facility]  SOME

## 2015-08-19 NOTE — H&P (Signed)
Real ConsJasmyn Lesinski is a 35 y.o. female presenting for SROM clear about 2:55 am. Some UCs.  Maternal Medical History:  Reason for admission: Rupture of membranes.   Fetal activity: Perceived fetal activity is normal.      OB History    Gravida Para Term Preterm AB TAB SAB Ectopic Multiple Living   3 2 2       2      Past Medical History  Diagnosis Date  . Hx of varicella   . 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 Anemia    Past Surgical History  Procedure Laterality Date  . Mouth surgery      as a child  . No past surgeries     Family History: family history includes Cancer in her paternal grandfather and paternal grandmother; Depression in her maternal grandmother, mother, and sister; Diabetes in her paternal grandfather; Heart failure in her maternal grandfather; Hypertension in her maternal grandfather and maternal grandmother. There is no history of Anesthesia problems, Hypotension, Malignant hyperthermia, or Pseudochol deficiency. Social History:  reports that she has never smoked. She has never used smokeless tobacco. She reports that she does not drink alcohol or use illicit drugs.   Prenatal Transfer Tool  Maternal Diabetes: No Genetic Screening: Normal Maternal Ultrasounds/Referrals: Normal Fetal Ultrasounds or other Referrals:  None Maternal Substance Abuse:  No Significant Maternal Medications:  None Significant Maternal Lab Results:  None Other Comments:  None  Review of Systems  Eyes: Negative for blurred vision.  Gastrointestinal: Negative for abdominal pain.  Neurological: Negative for headaches.    Dilation: 3 Effacement (%): 80 Station: -3 Exam by:: Orinda KennerKimberly Whitehurst Boyd RN Blood pressure 89/53, pulse 93, temperature 97.9 F (36.6 C), temperature source Oral, resp. rate 17, height 5\' 1"  (1.549 m), weight 139 lb (63.05 kg), last menstrual period 11/10/2014, currently breastfeeding. Maternal Exam:  Uterine Assessment: Contraction strength is mild.   Contraction frequency is irregular.   Abdomen: Patient reports no abdominal tenderness. Fetal presentation: vertex     Fetal Exam Fetal State Assessment: Category I - tracings are normal.     Physical Exam  Cardiovascular: Normal rate and regular rhythm.   Respiratory: Effort normal and breath sounds normal.  GI: Soft. There is no tenderness.  Neurological: She has normal reflexes.    Cx 2?50/-2/vtx/posterior cervix  Prenatal labs: ABO, Rh: --/--/O POS (04/18 16100603) Antibody: NEG (04/18 0603) Rubella: Immune (09/13 0000) RPR: Nonreactive (09/13 0000)  HBsAg: Negative (09/13 0000)  HIV: Non-reactive (09/13 0000)  GBS: Positive (03/15 0000)   Assessment/Plan: 35 yo G3P2 @ 40 2/7 weeks with SROM D/W patient If no firm, regular UCs in 1-2 hours will begin pitocin augmentation   Cumi Sanagustin II,Katlen Seyer E 08/19/2015, 7:52 AM

## 2015-08-19 NOTE — Anesthesia Procedure Notes (Addendum)
Epidural Patient location during procedure: OB Start time: 08/19/2015 1:01 PM End time: 08/19/2015 1:10 PM  Staffing Anesthesiologist: Heather RobertsSINGER, Katherine Lorentz Performed by: anesthesiologist and other anesthesia staff   Preanesthetic Checklist Completed: patient identified, site marked, pre-op evaluation, timeout performed, IV checked, risks and benefits discussed and monitors and equipment checked  Epidural Patient position: sitting Prep: DuraPrep Patient monitoring: heart rate, cardiac monitor, continuous pulse ox and blood pressure Approach: midline Location: L2-L3 Injection technique: LOR saline  Needle:  Needle type: Tuohy  Needle gauge: 17 G Needle length: 9 cm Needle insertion depth: 5 cm Catheter size: 20 Guage Catheter at skin depth: 10 cm Test dose: negative and Other  Assessment Events: blood not aspirated, injection not painful, no injection resistance and negative IV test  Additional Notes Informed consent obtained prior to proceeding including risk of failure, 1% risk of PDPH, risk of minor discomfort and bruising.  Discussed rare but serious complications including epidural abscess, permanent nerve injury, epidural hematoma.  Discussed alternatives to epidural analgesia and patient desires to proceed.  Timeout performed pre-procedure verifying patient name, procedure, and platelet count.  Patient tolerated procedure well. Assisted by Lyndee LeoKristen Reeve, SRNA

## 2015-08-19 NOTE — Consult Note (Signed)
[facility]  [facility]:  SVD    08/19/2015  6:05 PM  I was called to the delivery room at the request of the patient's obstetrician (Dr. Henderson Cloudomblin) for delivery of an infant with face presentation.  PRENATAL HX:  This is a 35 yo G3P2002 at 4140 and 2/[redacted] weeks gestation who was admitted this morning for SROM at around 3AM this morning (ROM ~15 hours).  Pregnancy was uncomplicated and labor was augmented with pitocin.  DELIVERY: Face presentation with loose nuchal cord.  Infant was vigorous at delivery, requiring no resuscitation other than standard warming, drying and stimulation.  APGARs 8 and 9.  Exam notable for edema along eyelids and forehead, bruising and ecchymosis along forehead and between eyes, and significant molding creating a trigonocephalic head shape.  Ears appear to be low set, though this may be related to molding from the face presentation.  Chin is small and recessed and palate is very narrow and high arched but does feel intact.  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  Per report, mother had mouth surgery as a child.  The infant did not have any breathing obstruction on her back and was satting 99% in RA by 10 minutes of age.  She appears stable to go to mother-baby unit.  Should she develop any respiratory concerns, please alert the neonatologist on call.  She may also have feeding difficulties given the abnormalities of her palate so feeding should also be monitored closely.      _____________________ Electronically Signed By: Maryan CharLindsey Tobby Fawcett, MD Neonatologist

## 2015-08-19 NOTE — Progress Notes (Signed)
Delivery Note At 5:47 PM a viable female was delivered via  (Presentation: Left Mentum Anterior).  APGAR: , ; weight  .   Placenta status: Intact, Spontaneous Pathology.  Cord:  with the following complications: .  Cord pH: pending When patient noted to be 10 cm dilated a probable face presentation is noted. Peds called to attend delivery. Patient delivered in rapid second stage in about 2 pushes. Loose nuchal cord x 1 noted. Cord clamped and cut and baby immediately handed to peds team. Facial edema noted. Also possible abnormal faces with high, narrow palate.  3 vessel cord. Placenta to pathology.  Anesthesia:  Epidural Episiotomy:  none Lacerations:  Second degree midline laceration repaired Suture Repair: 2.0 vicryl rapide Est. Blood Loss (mL): 250    Mom to postpartum.  Baby to Couplet care / Skin to Skin.  Jaceon Heiberger II,Ninamarie Keel E 08/19/2015, 6:07 PM

## 2015-08-19 NOTE — Progress Notes (Signed)
Epidural in Cx 3-4/90/-2 FHT cat one

## 2015-08-19 NOTE — Anesthesia Preprocedure Evaluation (Signed)
Anesthesia Evaluation  Patient identified by MRN, date of birth, ID band Patient awake    Reviewed: Allergy & Precautions, NPO status , Patient's Chart, lab work & pertinent test results  History of Anesthesia Complications Negative for: history of anesthetic complications  Airway Mallampati: II  TM Distance: >3 FB Neck ROM: Full    Dental  (+) Dental Advisory Given   Pulmonary neg pulmonary ROS,    Pulmonary exam normal        Cardiovascular negative cardio ROS Normal cardiovascular exam     Neuro/Psych PSYCHIATRIC DISORDERS Depression negative neurological ROS     GI/Hepatic negative GI ROS, Neg liver ROS,   Endo/Other  negative endocrine ROS  Renal/GU negative Renal ROS  negative genitourinary   Musculoskeletal negative musculoskeletal ROS (+)   Abdominal   Peds negative pediatric ROS (+)  Hematology negative hematology ROS (+)   Anesthesia Other Findings   Reproductive/Obstetrics (+) Pregnancy                             Anesthesia Physical Anesthesia Plan  ASA: II  Anesthesia Plan: Epidural   Post-op Pain Management:    Induction:   Airway Management Planned:   Additional Equipment:   Intra-op Plan:   Post-operative Plan:   Informed Consent: I have reviewed the patients History and Physical, chart, labs and discussed the procedure including the risks, benefits and alternatives for the proposed anesthesia with the patient or authorized representative who has indicated his/her understanding and acceptance.   Dental advisory given  Plan Discussed with: Anesthesiologist  Anesthesia Plan Comments:         Anesthesia Quick Evaluation

## 2015-08-20 LAB — CBC
HEMATOCRIT: 34.8 % — AB (ref 36.0–46.0)
Hemoglobin: 12.1 g/dL (ref 12.0–15.0)
MCH: 34.1 pg — ABNORMAL HIGH (ref 26.0–34.0)
MCHC: 34.8 g/dL (ref 30.0–36.0)
MCV: 98 fL (ref 78.0–100.0)
Platelets: 174 10*3/uL (ref 150–400)
RBC: 3.55 MIL/uL — ABNORMAL LOW (ref 3.87–5.11)
RDW: 13.2 % (ref 11.5–15.5)
WBC: 10.4 10*3/uL (ref 4.0–10.5)

## 2015-08-20 NOTE — Anesthesia Postprocedure Evaluation (Signed)
Anesthesia Post Note  Patient: Katherine Johnson  Procedure(s) Performed: * No procedures listed *  Patient location during evaluation: Mother Baby Anesthesia Type: Epidural Level of consciousness: awake Pain management: satisfactory to patient Vital Signs Assessment: post-procedure vital signs reviewed and stable Respiratory status: spontaneous breathing Cardiovascular status: stable Anesthetic complications: no    Last Vitals:  Filed Vitals:   08/20/15 0111 08/20/15 0606  BP: 95/63 92/58  Pulse: 71 83  Temp: 36.7 C 36.9 C  Resp: 16 18    Last Pain:  Filed Vitals:   08/20/15 0651  PainSc: 3                  [name]

## 2015-08-20 NOTE — Progress Notes (Signed)
Post Partum Day 1 Subjective: no complaints, up ad lib, voiding, tolerating PO and + flatus  Objective: Blood pressure 92/58, pulse 83, temperature 98.5 F (36.9 C), temperature source Oral, resp. rate 18, height 5\' 1"  (1.549 m), weight 139 lb (63.05 kg), last menstrual period 11/10/2014, SpO2 99 %, unknown if currently breastfeeding.  Physical Exam:  General: alert and cooperative Lochia: appropriate Uterine Fundus: firm Incision: healing well DVT Evaluation: No evidence of DVT seen on physical exam. Negative Homan's sign. No cords or calf tenderness. No significant calf/ankle edema.   Recent Labs  08/19/15 0603 08/20/15 0527  HGB 11.3* 12.1  HCT 32.9* 34.8*    Assessment/Plan: Plan for discharge tomorrow   LOS: 1 day   Pete Merten G 08/20/2015, 7:59 AM

## 2015-08-20 NOTE — Lactation Note (Signed)
This note was copied from a baby's chart. Lactation Consultation Note Experienced BF mom has a 34 yr old which she BF for 19 months and her 34 yr old she BF for 27 months. Mom has only had soreness with Bf her other children. Mom states this baby is Bf great. Mom had no issues with milk supply and plans on BF this one about the same amount of time as with her last one. Reviewed about newborn behavior, STS, I&O, cluster feeding, supply and demand.WH/LC brochure given w/resources, support groups and LC services. Patient Name: Girl Real ConsJasmyn Pettibone WJXBJ'YToday's Date: 08/20/2015 Reason for consult: Initial assessment   Maternal Data Has patient been taught Hand Expression?: Yes Does the patient have breastfeeding experience prior to this delivery?: Yes  Feeding Feeding Type: Breast Fed Length of feed: 25 min  LATCH Score/Interventions                      Lactation Tools Discussed/Used     Consult Status Consult Status: PRN Date: 08/21/15 Follow-up type: In-patient    Arva Slaugh, Diamond NickelLAURA G 08/20/2015, 1:19 AM

## 2015-08-21 NOTE — Addendum Note (Signed)
Addendum  created 08/21/15 2033 by Heather RobertsJames Sly Parlee, MD   Modules edited: Anesthesia Responsible Staff

## 2015-08-21 NOTE — Discharge Summary (Signed)
Obstetric Discharge Summary Reason for Admission: rupture of membranes Prenatal Procedures: ultrasound Intrapartum Procedures: spontaneous vaginal delivery Postpartum Procedures: none Complications-Operative and Postpartum: 2 degree perineal laceration HEMOGLOBIN  Date Value Ref Range Status  08/20/2015 12.1 12.0 - 15.0 g/dL Final   HGB  Date Value Ref Range Status  12/06/2012 9.0* 11.6 - 15.9 g/dL Final   HCT  Date Value Ref Range Status  08/20/2015 34.8* 36.0 - 46.0 % Final  12/06/2012 27.1* 34.8 - 46.6 % Final    Physical Exam:  General: alert and cooperative Lochia: appropriate Uterine Fundus: firm Incision: healing well DVT Evaluation: No evidence of DVT seen on physical exam. Negative Homan's sign. No cords or calf tenderness. No significant calf/ankle edema.  Discharge Diagnoses: Term Pregnancy-delivered  Discharge Information: Date: 08/21/2015 Activity: pelvic rest Diet: routine Medications: PNV and Colace Condition: stable Instructions: refer to practice specific booklet Discharge to: home   Newborn Data: Live born female  Birth Weight: 7 lb 7.6 oz (3390 g) APGAR: 8, 9  Home with mother.  Takya Vandivier G 08/21/2015, 8:06 AM

## 2015-08-21 NOTE — Lactation Note (Signed)
This note was copied from a baby's chart. Lactation Consultation Note Experienced BF mom of two other children states BF is going great. Has a few questions. Reviewed power pumping and some medications for allergies and milk supply. WH/LC brochure given w/resources, support groups and LC services.WH/LC brochure given w/resources, support groups and LC services. Patient Name: Katherine Johnson AVWUJ'WToday's Date: 08/21/2015 Reason for consult: Follow-up assessment   Maternal Data    Feeding Feeding Type: Breast Fed Length of feed: 15 min  LATCH Score/Interventions Latch: Grasps breast easily, tongue down, lips flanged, rhythmical sucking.  Audible Swallowing: Spontaneous and intermittent  Type of Nipple: Everted at rest and after stimulation  Comfort (Breast/Nipple): Soft / non-tender     Hold (Positioning): No assistance needed to correctly position infant at breast.  LATCH Score: 10  Lactation Tools Discussed/Used     Consult Status Consult Status: Complete Date: 08/21/15    Charyl DancerCARVER, Alyshia Kernan G 08/21/2015, 6:45 AM

## 2015-08-22 ENCOUNTER — Inpatient Hospital Stay (HOSPITAL_COMMUNITY): Admission: RE | Admit: 2015-08-22 | Payer: BC Managed Care – PPO | Source: Ambulatory Visit
